# Patient Record
Sex: Female | Born: 1995
Health system: Southern US, Community
[De-identification: ages and names within clinical notes are randomized; demographics above are authoritative.]

## PROBLEM LIST (undated history)

## (undated) DIAGNOSIS — F329 Major depressive disorder, single episode, unspecified: Secondary | ICD-10-CM

## (undated) DIAGNOSIS — F419 Anxiety disorder, unspecified: Secondary | ICD-10-CM

## (undated) DIAGNOSIS — Z5189 Encounter for other specified aftercare: Secondary | ICD-10-CM

## (undated) DIAGNOSIS — O24419 Gestational diabetes mellitus in pregnancy, unspecified control: Secondary | ICD-10-CM

## (undated) DIAGNOSIS — Z8759 Personal history of other complications of pregnancy, childbirth and the puerperium: Secondary | ICD-10-CM

## (undated) DIAGNOSIS — F32A Depression, unspecified: Secondary | ICD-10-CM

## (undated) DIAGNOSIS — R000 Tachycardia, unspecified: Secondary | ICD-10-CM

## (undated) HISTORY — DX: Anxiety disorder, unspecified: F41.9

## (undated) HISTORY — PX: CHOLECYSTECTOMY: SHX55

## (undated) HISTORY — PX: BREAST SURGERY: SHX581

## (undated) HISTORY — PX: APPENDECTOMY: SHX54

---
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
## 2015-11-10 DIAGNOSIS — L03213 Periorbital cellulitis: Secondary | ICD-10-CM | POA: Diagnosis not present

## 2015-12-17 DIAGNOSIS — L259 Unspecified contact dermatitis, unspecified cause: Secondary | ICD-10-CM | POA: Diagnosis not present

## 2016-02-15 DIAGNOSIS — Z30432 Encounter for removal of intrauterine contraceptive device: Secondary | ICD-10-CM | POA: Diagnosis not present

## 2016-03-24 DIAGNOSIS — R300 Dysuria: Secondary | ICD-10-CM | POA: Diagnosis not present

## 2016-03-24 DIAGNOSIS — N3090 Cystitis, unspecified without hematuria: Secondary | ICD-10-CM | POA: Diagnosis not present

## 2016-03-24 DIAGNOSIS — N3001 Acute cystitis with hematuria: Secondary | ICD-10-CM | POA: Diagnosis not present

## 2016-05-08 DIAGNOSIS — J0300 Acute streptococcal tonsillitis, unspecified: Secondary | ICD-10-CM | POA: Diagnosis not present

## 2016-06-03 DIAGNOSIS — N979 Female infertility, unspecified: Secondary | ICD-10-CM | POA: Diagnosis not present

## 2016-06-16 DIAGNOSIS — N979 Female infertility, unspecified: Secondary | ICD-10-CM | POA: Diagnosis not present

## 2016-07-09 DIAGNOSIS — N979 Female infertility, unspecified: Secondary | ICD-10-CM | POA: Diagnosis not present

## 2016-07-28 DIAGNOSIS — N926 Irregular menstruation, unspecified: Secondary | ICD-10-CM | POA: Diagnosis not present

## 2016-07-28 DIAGNOSIS — N83209 Unspecified ovarian cyst, unspecified side: Secondary | ICD-10-CM | POA: Diagnosis not present

## 2016-09-08 DIAGNOSIS — R509 Fever, unspecified: Secondary | ICD-10-CM | POA: Diagnosis not present

## 2016-09-08 DIAGNOSIS — R69 Illness, unspecified: Secondary | ICD-10-CM | POA: Diagnosis not present

## 2016-09-16 DIAGNOSIS — N970 Female infertility associated with anovulation: Secondary | ICD-10-CM | POA: Diagnosis not present

## 2016-10-20 DIAGNOSIS — N970 Female infertility associated with anovulation: Secondary | ICD-10-CM | POA: Diagnosis not present

## 2016-11-30 DIAGNOSIS — J020 Streptococcal pharyngitis: Secondary | ICD-10-CM | POA: Diagnosis not present

## 2016-12-12 DIAGNOSIS — O0901 Supervision of pregnancy with history of infertility, first trimester: Secondary | ICD-10-CM | POA: Diagnosis not present

## 2016-12-12 DIAGNOSIS — Z3A01 Less than 8 weeks gestation of pregnancy: Secondary | ICD-10-CM | POA: Diagnosis not present

## 2017-01-09 DIAGNOSIS — O3680X0 Pregnancy with inconclusive fetal viability, not applicable or unspecified: Secondary | ICD-10-CM | POA: Diagnosis not present

## 2017-01-09 DIAGNOSIS — O219 Vomiting of pregnancy, unspecified: Secondary | ICD-10-CM | POA: Diagnosis not present

## 2017-01-09 DIAGNOSIS — Z369 Encounter for antenatal screening, unspecified: Secondary | ICD-10-CM | POA: Diagnosis not present

## 2017-01-19 DIAGNOSIS — R1013 Epigastric pain: Secondary | ICD-10-CM | POA: Diagnosis not present

## 2017-01-19 DIAGNOSIS — R1011 Right upper quadrant pain: Secondary | ICD-10-CM | POA: Diagnosis not present

## 2017-01-19 DIAGNOSIS — Z3A12 12 weeks gestation of pregnancy: Secondary | ICD-10-CM | POA: Diagnosis not present

## 2017-01-19 DIAGNOSIS — R112 Nausea with vomiting, unspecified: Secondary | ICD-10-CM | POA: Diagnosis not present

## 2017-01-19 DIAGNOSIS — O26891 Other specified pregnancy related conditions, first trimester: Secondary | ICD-10-CM | POA: Diagnosis not present

## 2017-01-22 DIAGNOSIS — Z3682 Encounter for antenatal screening for nuchal translucency: Secondary | ICD-10-CM | POA: Diagnosis not present

## 2017-01-22 DIAGNOSIS — O99211 Obesity complicating pregnancy, first trimester: Secondary | ICD-10-CM | POA: Diagnosis not present

## 2017-01-30 DIAGNOSIS — Z3A13 13 weeks gestation of pregnancy: Secondary | ICD-10-CM | POA: Diagnosis not present

## 2017-01-30 DIAGNOSIS — O99211 Obesity complicating pregnancy, first trimester: Secondary | ICD-10-CM | POA: Diagnosis not present

## 2017-01-30 DIAGNOSIS — O281 Abnormal biochemical finding on antenatal screening of mother: Secondary | ICD-10-CM | POA: Diagnosis not present

## 2017-01-30 DIAGNOSIS — Z3682 Encounter for antenatal screening for nuchal translucency: Secondary | ICD-10-CM | POA: Diagnosis not present

## 2017-02-19 DIAGNOSIS — Z368A Encounter for antenatal screening for other genetic defects: Secondary | ICD-10-CM | POA: Diagnosis not present

## 2017-02-19 DIAGNOSIS — O99212 Obesity complicating pregnancy, second trimester: Secondary | ICD-10-CM | POA: Diagnosis not present

## 2017-03-10 DIAGNOSIS — Z3A19 19 weeks gestation of pregnancy: Secondary | ICD-10-CM | POA: Diagnosis not present

## 2017-03-10 DIAGNOSIS — E860 Dehydration: Secondary | ICD-10-CM | POA: Diagnosis not present

## 2017-03-10 DIAGNOSIS — O219 Vomiting of pregnancy, unspecified: Secondary | ICD-10-CM | POA: Diagnosis not present

## 2017-03-10 DIAGNOSIS — O26891 Other specified pregnancy related conditions, first trimester: Secondary | ICD-10-CM | POA: Diagnosis not present

## 2017-03-10 DIAGNOSIS — R112 Nausea with vomiting, unspecified: Secondary | ICD-10-CM | POA: Diagnosis not present

## 2017-03-10 DIAGNOSIS — R109 Unspecified abdominal pain: Secondary | ICD-10-CM | POA: Diagnosis not present

## 2017-03-19 DIAGNOSIS — Z3689 Encounter for other specified antenatal screening: Secondary | ICD-10-CM | POA: Diagnosis not present

## 2017-03-19 DIAGNOSIS — O99212 Obesity complicating pregnancy, second trimester: Secondary | ICD-10-CM | POA: Diagnosis not present

## 2017-03-20 DIAGNOSIS — O139 Gestational [pregnancy-induced] hypertension without significant proteinuria, unspecified trimester: Secondary | ICD-10-CM | POA: Diagnosis not present

## 2017-04-30 DIAGNOSIS — O359XX0 Maternal care for (suspected) fetal abnormality and damage, unspecified, not applicable or unspecified: Secondary | ICD-10-CM | POA: Diagnosis not present

## 2017-04-30 DIAGNOSIS — Z3A26 26 weeks gestation of pregnancy: Secondary | ICD-10-CM | POA: Diagnosis not present

## 2017-04-30 DIAGNOSIS — O4402 Placenta previa specified as without hemorrhage, second trimester: Secondary | ICD-10-CM | POA: Diagnosis not present

## 2017-05-14 DIAGNOSIS — Z3A28 28 weeks gestation of pregnancy: Secondary | ICD-10-CM | POA: Diagnosis not present

## 2017-05-14 DIAGNOSIS — Z369 Encounter for antenatal screening, unspecified: Secondary | ICD-10-CM | POA: Diagnosis not present

## 2017-05-14 DIAGNOSIS — O10013 Pre-existing essential hypertension complicating pregnancy, third trimester: Secondary | ICD-10-CM | POA: Diagnosis not present

## 2017-05-14 DIAGNOSIS — Z3689 Encounter for other specified antenatal screening: Secondary | ICD-10-CM | POA: Diagnosis not present

## 2017-05-14 DIAGNOSIS — O09892 Supervision of other high risk pregnancies, second trimester: Secondary | ICD-10-CM | POA: Diagnosis not present

## 2017-05-14 DIAGNOSIS — Z6791 Unspecified blood type, Rh negative: Secondary | ICD-10-CM | POA: Diagnosis not present

## 2017-05-14 DIAGNOSIS — O99213 Obesity complicating pregnancy, third trimester: Secondary | ICD-10-CM | POA: Diagnosis not present

## 2017-05-18 DIAGNOSIS — R7302 Impaired glucose tolerance (oral): Secondary | ICD-10-CM | POA: Diagnosis not present

## 2017-06-04 DIAGNOSIS — R600 Localized edema: Secondary | ICD-10-CM | POA: Diagnosis not present

## 2017-06-04 DIAGNOSIS — Z3A31 31 weeks gestation of pregnancy: Secondary | ICD-10-CM | POA: Diagnosis not present

## 2017-06-04 DIAGNOSIS — R51 Headache: Secondary | ICD-10-CM | POA: Diagnosis not present

## 2017-06-04 DIAGNOSIS — O133 Gestational [pregnancy-induced] hypertension without significant proteinuria, third trimester: Secondary | ICD-10-CM | POA: Diagnosis not present

## 2017-06-05 DIAGNOSIS — R51 Headache: Secondary | ICD-10-CM | POA: Diagnosis not present

## 2017-06-05 DIAGNOSIS — Z3A31 31 weeks gestation of pregnancy: Secondary | ICD-10-CM | POA: Diagnosis not present

## 2017-06-05 DIAGNOSIS — O133 Gestational [pregnancy-induced] hypertension without significant proteinuria, third trimester: Secondary | ICD-10-CM | POA: Diagnosis not present

## 2017-06-05 DIAGNOSIS — R600 Localized edema: Secondary | ICD-10-CM | POA: Diagnosis not present

## 2017-06-09 DIAGNOSIS — O26843 Uterine size-date discrepancy, third trimester: Secondary | ICD-10-CM | POA: Diagnosis not present

## 2017-06-09 DIAGNOSIS — Z3A31 31 weeks gestation of pregnancy: Secondary | ICD-10-CM | POA: Diagnosis not present

## 2017-06-09 DIAGNOSIS — O9989 Other specified diseases and conditions complicating pregnancy, childbirth and the puerperium: Secondary | ICD-10-CM | POA: Diagnosis not present

## 2017-06-09 DIAGNOSIS — O133 Gestational [pregnancy-induced] hypertension without significant proteinuria, third trimester: Secondary | ICD-10-CM | POA: Diagnosis not present

## 2017-06-09 DIAGNOSIS — Z3689 Encounter for other specified antenatal screening: Secondary | ICD-10-CM | POA: Diagnosis not present

## 2017-06-09 DIAGNOSIS — R51 Headache: Secondary | ICD-10-CM | POA: Diagnosis not present

## 2017-06-17 DIAGNOSIS — Z3689 Encounter for other specified antenatal screening: Secondary | ICD-10-CM | POA: Diagnosis not present

## 2017-06-17 DIAGNOSIS — O133 Gestational [pregnancy-induced] hypertension without significant proteinuria, third trimester: Secondary | ICD-10-CM | POA: Diagnosis not present

## 2017-06-17 DIAGNOSIS — Z3A33 33 weeks gestation of pregnancy: Secondary | ICD-10-CM | POA: Diagnosis not present

## 2017-06-17 DIAGNOSIS — R600 Localized edema: Secondary | ICD-10-CM | POA: Diagnosis not present

## 2017-06-18 DIAGNOSIS — O133 Gestational [pregnancy-induced] hypertension without significant proteinuria, third trimester: Secondary | ICD-10-CM | POA: Diagnosis not present

## 2017-06-18 DIAGNOSIS — Z3A00 Weeks of gestation of pregnancy not specified: Secondary | ICD-10-CM | POA: Diagnosis not present

## 2017-06-19 DIAGNOSIS — Z3A33 33 weeks gestation of pregnancy: Secondary | ICD-10-CM | POA: Diagnosis not present

## 2017-06-19 DIAGNOSIS — Z3689 Encounter for other specified antenatal screening: Secondary | ICD-10-CM | POA: Diagnosis not present

## 2017-06-19 DIAGNOSIS — O9989 Other specified diseases and conditions complicating pregnancy, childbirth and the puerperium: Secondary | ICD-10-CM | POA: Diagnosis not present

## 2017-06-19 DIAGNOSIS — R51 Headache: Secondary | ICD-10-CM | POA: Diagnosis not present

## 2017-06-19 DIAGNOSIS — Z0379 Encounter for other suspected maternal and fetal conditions ruled out: Secondary | ICD-10-CM | POA: Diagnosis not present

## 2017-06-19 DIAGNOSIS — H538 Other visual disturbances: Secondary | ICD-10-CM | POA: Diagnosis not present

## 2017-06-22 DIAGNOSIS — O133 Gestational [pregnancy-induced] hypertension without significant proteinuria, third trimester: Secondary | ICD-10-CM | POA: Diagnosis not present

## 2017-06-22 DIAGNOSIS — Z3A33 33 weeks gestation of pregnancy: Secondary | ICD-10-CM | POA: Diagnosis not present

## 2017-06-22 DIAGNOSIS — Z3689 Encounter for other specified antenatal screening: Secondary | ICD-10-CM | POA: Diagnosis not present

## 2017-06-27 DIAGNOSIS — J0100 Acute maxillary sinusitis, unspecified: Secondary | ICD-10-CM | POA: Diagnosis not present

## 2017-06-29 DIAGNOSIS — R079 Chest pain, unspecified: Secondary | ICD-10-CM | POA: Diagnosis not present

## 2017-06-29 DIAGNOSIS — O99284 Endocrine, nutritional and metabolic diseases complicating childbirth: Secondary | ICD-10-CM | POA: Diagnosis not present

## 2017-06-29 DIAGNOSIS — Z3483 Encounter for supervision of other normal pregnancy, third trimester: Secondary | ICD-10-CM | POA: Diagnosis not present

## 2017-06-29 DIAGNOSIS — O339 Maternal care for disproportion, unspecified: Secondary | ICD-10-CM | POA: Diagnosis not present

## 2017-06-29 DIAGNOSIS — E861 Hypovolemia: Secondary | ICD-10-CM | POA: Diagnosis not present

## 2017-06-29 DIAGNOSIS — I361 Nonrheumatic tricuspid (valve) insufficiency: Secondary | ICD-10-CM | POA: Diagnosis not present

## 2017-06-29 DIAGNOSIS — L22 Diaper dermatitis: Secondary | ICD-10-CM | POA: Diagnosis not present

## 2017-06-29 DIAGNOSIS — O1414 Severe pre-eclampsia complicating childbirth: Secondary | ICD-10-CM | POA: Diagnosis not present

## 2017-06-29 DIAGNOSIS — Z412 Encounter for routine and ritual male circumcision: Secondary | ICD-10-CM | POA: Diagnosis not present

## 2017-06-29 DIAGNOSIS — O1493 Unspecified pre-eclampsia, third trimester: Secondary | ICD-10-CM | POA: Diagnosis not present

## 2017-06-29 DIAGNOSIS — O1413 Severe pre-eclampsia, third trimester: Secondary | ICD-10-CM | POA: Diagnosis not present

## 2017-06-29 DIAGNOSIS — I517 Cardiomegaly: Secondary | ICD-10-CM | POA: Diagnosis not present

## 2017-06-29 DIAGNOSIS — Z3A00 Weeks of gestation of pregnancy not specified: Secondary | ICD-10-CM | POA: Diagnosis not present

## 2017-06-29 DIAGNOSIS — O9902 Anemia complicating childbirth: Secondary | ICD-10-CM | POA: Diagnosis not present

## 2017-06-29 DIAGNOSIS — R0789 Other chest pain: Secondary | ICD-10-CM | POA: Diagnosis not present

## 2017-06-29 DIAGNOSIS — Z3A35 35 weeks gestation of pregnancy: Secondary | ICD-10-CM | POA: Diagnosis not present

## 2017-06-29 DIAGNOSIS — N471 Phimosis: Secondary | ICD-10-CM | POA: Diagnosis not present

## 2017-06-29 DIAGNOSIS — R000 Tachycardia, unspecified: Secondary | ICD-10-CM | POA: Diagnosis not present

## 2017-06-29 DIAGNOSIS — O334XX1 Maternal care for disproportion of mixed maternal and fetal origin, fetus 1: Secondary | ICD-10-CM | POA: Diagnosis not present

## 2017-06-29 DIAGNOSIS — D649 Anemia, unspecified: Secondary | ICD-10-CM | POA: Diagnosis not present

## 2017-06-29 DIAGNOSIS — D62 Acute posthemorrhagic anemia: Secondary | ICD-10-CM | POA: Diagnosis not present

## 2017-06-29 DIAGNOSIS — Z3689 Encounter for other specified antenatal screening: Secondary | ICD-10-CM | POA: Diagnosis not present

## 2017-06-30 DIAGNOSIS — R000 Tachycardia, unspecified: Secondary | ICD-10-CM | POA: Diagnosis not present

## 2017-07-03 DIAGNOSIS — Z3689 Encounter for other specified antenatal screening: Secondary | ICD-10-CM | POA: Diagnosis not present

## 2017-07-05 DIAGNOSIS — O339 Maternal care for disproportion, unspecified: Secondary | ICD-10-CM | POA: Diagnosis not present

## 2017-07-05 DIAGNOSIS — O1493 Unspecified pre-eclampsia, third trimester: Secondary | ICD-10-CM | POA: Diagnosis not present

## 2017-07-05 DIAGNOSIS — R000 Tachycardia, unspecified: Secondary | ICD-10-CM

## 2017-07-05 DIAGNOSIS — O334XX1 Maternal care for disproportion of mixed maternal and fetal origin, fetus 1: Secondary | ICD-10-CM | POA: Diagnosis not present

## 2017-07-05 HISTORY — DX: Tachycardia, unspecified: R00.0

## 2017-07-06 DIAGNOSIS — R0789 Other chest pain: Secondary | ICD-10-CM | POA: Diagnosis not present

## 2017-07-06 DIAGNOSIS — R000 Tachycardia, unspecified: Secondary | ICD-10-CM | POA: Diagnosis not present

## 2017-07-06 DIAGNOSIS — Z412 Encounter for routine and ritual male circumcision: Secondary | ICD-10-CM | POA: Diagnosis not present

## 2017-07-06 DIAGNOSIS — R079 Chest pain, unspecified: Secondary | ICD-10-CM | POA: Diagnosis not present

## 2017-07-06 DIAGNOSIS — O1413 Severe pre-eclampsia, third trimester: Secondary | ICD-10-CM | POA: Diagnosis not present

## 2017-07-06 DIAGNOSIS — D649 Anemia, unspecified: Secondary | ICD-10-CM | POA: Diagnosis not present

## 2017-07-07 DIAGNOSIS — I517 Cardiomegaly: Secondary | ICD-10-CM | POA: Diagnosis not present

## 2017-07-07 DIAGNOSIS — I361 Nonrheumatic tricuspid (valve) insufficiency: Secondary | ICD-10-CM | POA: Diagnosis not present

## 2017-07-07 DIAGNOSIS — D649 Anemia, unspecified: Secondary | ICD-10-CM | POA: Diagnosis not present

## 2017-07-07 DIAGNOSIS — O1413 Severe pre-eclampsia, third trimester: Secondary | ICD-10-CM | POA: Diagnosis not present

## 2017-07-07 DIAGNOSIS — Z3A00 Weeks of gestation of pregnancy not specified: Secondary | ICD-10-CM | POA: Diagnosis not present

## 2017-07-07 DIAGNOSIS — R000 Tachycardia, unspecified: Secondary | ICD-10-CM | POA: Diagnosis not present

## 2017-07-08 DIAGNOSIS — D62 Acute posthemorrhagic anemia: Secondary | ICD-10-CM | POA: Diagnosis not present

## 2017-07-08 DIAGNOSIS — R0789 Other chest pain: Secondary | ICD-10-CM | POA: Diagnosis not present

## 2017-07-08 DIAGNOSIS — R000 Tachycardia, unspecified: Secondary | ICD-10-CM | POA: Diagnosis not present

## 2017-07-09 DIAGNOSIS — L22 Diaper dermatitis: Secondary | ICD-10-CM | POA: Diagnosis not present

## 2017-11-13 DIAGNOSIS — Z1331 Encounter for screening for depression: Secondary | ICD-10-CM | POA: Diagnosis not present

## 2017-11-13 DIAGNOSIS — Z1339 Encounter for screening examination for other mental health and behavioral disorders: Secondary | ICD-10-CM | POA: Diagnosis not present

## 2017-11-13 DIAGNOSIS — Z30011 Encounter for initial prescription of contraceptive pills: Secondary | ICD-10-CM | POA: Diagnosis not present

## 2017-11-13 DIAGNOSIS — Z6841 Body Mass Index (BMI) 40.0 and over, adult: Secondary | ICD-10-CM | POA: Diagnosis not present

## 2018-05-16 DIAGNOSIS — N3090 Cystitis, unspecified without hematuria: Secondary | ICD-10-CM | POA: Diagnosis not present

## 2018-07-06 DIAGNOSIS — F319 Bipolar disorder, unspecified: Secondary | ICD-10-CM | POA: Diagnosis not present

## 2018-07-06 DIAGNOSIS — Z6841 Body Mass Index (BMI) 40.0 and over, adult: Secondary | ICD-10-CM | POA: Diagnosis not present

## 2018-07-06 DIAGNOSIS — Z23 Encounter for immunization: Secondary | ICD-10-CM | POA: Diagnosis not present

## 2018-07-22 DIAGNOSIS — F319 Bipolar disorder, unspecified: Secondary | ICD-10-CM | POA: Diagnosis not present

## 2018-07-22 DIAGNOSIS — Z6841 Body Mass Index (BMI) 40.0 and over, adult: Secondary | ICD-10-CM | POA: Diagnosis not present

## 2018-08-06 DIAGNOSIS — N3000 Acute cystitis without hematuria: Secondary | ICD-10-CM | POA: Diagnosis not present

## 2018-08-24 DIAGNOSIS — F3131 Bipolar disorder, current episode depressed, mild: Secondary | ICD-10-CM | POA: Diagnosis not present

## 2018-08-24 DIAGNOSIS — F410 Panic disorder [episodic paroxysmal anxiety] without agoraphobia: Secondary | ICD-10-CM | POA: Diagnosis not present

## 2018-09-07 DIAGNOSIS — F3131 Bipolar disorder, current episode depressed, mild: Secondary | ICD-10-CM | POA: Diagnosis not present

## 2018-09-28 DIAGNOSIS — F3131 Bipolar disorder, current episode depressed, mild: Secondary | ICD-10-CM | POA: Diagnosis not present

## 2019-01-07 DIAGNOSIS — Z98891 History of uterine scar from previous surgery: Secondary | ICD-10-CM | POA: Insufficient documentation

## 2019-01-07 DIAGNOSIS — R638 Other symptoms and signs concerning food and fluid intake: Secondary | ICD-10-CM | POA: Insufficient documentation

## 2019-01-07 DIAGNOSIS — Z3201 Encounter for pregnancy test, result positive: Secondary | ICD-10-CM | POA: Diagnosis not present

## 2019-01-07 DIAGNOSIS — Z3491 Encounter for supervision of normal pregnancy, unspecified, first trimester: Secondary | ICD-10-CM | POA: Diagnosis not present

## 2019-01-07 DIAGNOSIS — N925 Other specified irregular menstruation: Secondary | ICD-10-CM | POA: Diagnosis not present

## 2019-01-07 DIAGNOSIS — Z124 Encounter for screening for malignant neoplasm of cervix: Secondary | ICD-10-CM | POA: Diagnosis not present

## 2019-01-07 DIAGNOSIS — Z8751 Personal history of pre-term labor: Secondary | ICD-10-CM | POA: Insufficient documentation

## 2019-02-16 DIAGNOSIS — Z3480 Encounter for supervision of other normal pregnancy, unspecified trimester: Secondary | ICD-10-CM | POA: Diagnosis not present

## 2019-02-16 DIAGNOSIS — Z369 Encounter for antenatal screening, unspecified: Secondary | ICD-10-CM | POA: Diagnosis not present

## 2019-02-16 LAB — OB RESULTS CONSOLE RPR: RPR: NONREACTIVE

## 2019-02-16 LAB — OB RESULTS CONSOLE ABO/RH: RH Type: NEGATIVE

## 2019-02-16 LAB — OB RESULTS CONSOLE GC/CHLAMYDIA
Chlamydia: NEGATIVE
Gonorrhea: NEGATIVE

## 2019-02-16 LAB — OB RESULTS CONSOLE HIV ANTIBODY (ROUTINE TESTING): HIV: NONREACTIVE

## 2019-02-16 LAB — OB RESULTS CONSOLE RUBELLA ANTIBODY, IGM: Rubella: NON-IMMUNE/NOT IMMUNE

## 2019-02-16 LAB — OB RESULTS CONSOLE HEPATITIS B SURFACE ANTIGEN: Hepatitis B Surface Ag: NEGATIVE

## 2019-02-16 LAB — OB RESULTS CONSOLE ANTIBODY SCREEN: Antibody Screen: NEGATIVE

## 2019-03-29 DIAGNOSIS — O99212 Obesity complicating pregnancy, second trimester: Secondary | ICD-10-CM | POA: Diagnosis not present

## 2019-03-29 DIAGNOSIS — Z36 Encounter for antenatal screening for chromosomal anomalies: Secondary | ICD-10-CM | POA: Diagnosis not present

## 2019-05-26 DIAGNOSIS — Z23 Encounter for immunization: Secondary | ICD-10-CM | POA: Diagnosis not present

## 2019-06-01 DIAGNOSIS — Y718 Miscellaneous cardiovascular devices associated with adverse incidents, not elsewhere classified: Secondary | ICD-10-CM | POA: Diagnosis not present

## 2019-06-01 DIAGNOSIS — U071 COVID-19: Secondary | ICD-10-CM | POA: Diagnosis not present

## 2019-06-01 DIAGNOSIS — R509 Fever, unspecified: Secondary | ICD-10-CM | POA: Diagnosis not present

## 2019-06-08 ENCOUNTER — Other Ambulatory Visit: Payer: Self-pay

## 2019-06-08 ENCOUNTER — Encounter (HOSPITAL_COMMUNITY): Payer: Self-pay | Admitting: *Deleted

## 2019-06-08 ENCOUNTER — Emergency Department (HOSPITAL_COMMUNITY): Payer: BC Managed Care – PPO

## 2019-06-08 ENCOUNTER — Inpatient Hospital Stay (HOSPITAL_COMMUNITY)
Admission: EM | Admit: 2019-06-08 | Discharge: 2019-06-13 | DRG: 831 | Disposition: A | Discharge status: Home / Self Care | Payer: BC Managed Care – PPO | Attending: Internal Medicine | Admitting: Internal Medicine

## 2019-06-08 DIAGNOSIS — D649 Anemia, unspecified: Secondary | ICD-10-CM | POA: Diagnosis not present

## 2019-06-08 DIAGNOSIS — O99513 Diseases of the respiratory system complicating pregnancy, third trimester: Secondary | ICD-10-CM | POA: Diagnosis present

## 2019-06-08 DIAGNOSIS — O99013 Anemia complicating pregnancy, third trimester: Secondary | ICD-10-CM | POA: Diagnosis not present

## 2019-06-08 DIAGNOSIS — O99891 Other specified diseases and conditions complicating pregnancy: Secondary | ICD-10-CM | POA: Diagnosis not present

## 2019-06-08 DIAGNOSIS — E860 Dehydration: Secondary | ICD-10-CM | POA: Diagnosis present

## 2019-06-08 DIAGNOSIS — O98813 Other maternal infectious and parasitic diseases complicating pregnancy, third trimester: Secondary | ICD-10-CM | POA: Diagnosis not present

## 2019-06-08 DIAGNOSIS — O99283 Endocrine, nutritional and metabolic diseases complicating pregnancy, third trimester: Secondary | ICD-10-CM | POA: Diagnosis not present

## 2019-06-08 DIAGNOSIS — Z3A28 28 weeks gestation of pregnancy: Secondary | ICD-10-CM | POA: Diagnosis not present

## 2019-06-08 DIAGNOSIS — O98913 Unspecified maternal infectious and parasitic disease complicating pregnancy, third trimester: Secondary | ICD-10-CM | POA: Diagnosis present

## 2019-06-08 DIAGNOSIS — O99512 Diseases of the respiratory system complicating pregnancy, second trimester: Secondary | ICD-10-CM | POA: Diagnosis not present

## 2019-06-08 DIAGNOSIS — O368130 Decreased fetal movements, third trimester, not applicable or unspecified: Secondary | ICD-10-CM | POA: Diagnosis not present

## 2019-06-08 DIAGNOSIS — U071 COVID-19: Secondary | ICD-10-CM | POA: Diagnosis not present

## 2019-06-08 DIAGNOSIS — O98513 Other viral diseases complicating pregnancy, third trimester: Principal | ICD-10-CM | POA: Diagnosis present

## 2019-06-08 DIAGNOSIS — J1289 Other viral pneumonia: Secondary | ICD-10-CM | POA: Diagnosis present

## 2019-06-08 DIAGNOSIS — R000 Tachycardia, unspecified: Secondary | ICD-10-CM | POA: Diagnosis not present

## 2019-06-08 DIAGNOSIS — R0902 Hypoxemia: Secondary | ICD-10-CM | POA: Diagnosis not present

## 2019-06-08 DIAGNOSIS — O98512 Other viral diseases complicating pregnancy, second trimester: Secondary | ICD-10-CM | POA: Diagnosis not present

## 2019-06-08 DIAGNOSIS — J1281 Pneumonia due to SARS-associated coronavirus: Secondary | ICD-10-CM | POA: Diagnosis not present

## 2019-06-08 DIAGNOSIS — R0602 Shortness of breath: Secondary | ICD-10-CM | POA: Diagnosis not present

## 2019-06-08 LAB — COMPREHENSIVE METABOLIC PANEL
ALT: 12 U/L (ref 0–44)
AST: 14 U/L — ABNORMAL LOW (ref 15–41)
Albumin: 2.5 g/dL — ABNORMAL LOW (ref 3.5–5.0)
Alkaline Phosphatase: 78 U/L (ref 38–126)
Anion gap: 12 (ref 5–15)
BUN: <5 mg/dL — ABNORMAL LOW (ref 6–20)
CO2: 19 mmol/L — ABNORMAL LOW (ref 22–32)
Calcium: 8.7 mg/dL — ABNORMAL LOW (ref 8.9–10.3)
Chloride: 109 mmol/L (ref 98–111)
Creatinine, Ser: 0.66 mg/dL (ref 0.44–1.00)
GFR calc Af Amer: >60 mL/min (ref >60)
GFR calc non Af Amer: >60 mL/min (ref >60)
Glucose, Bld: 80 mg/dL (ref 70–99)
Potassium: 3.6 mmol/L (ref 3.5–5.1)
Sodium: 140 mmol/L (ref 135–145)
Total Bilirubin: 0.5 mg/dL (ref 0.3–1.2)
Total Protein: 5.9 g/dL — ABNORMAL LOW (ref 6.5–8.1)

## 2019-06-08 LAB — URINALYSIS, ROUTINE W REFLEX MICROSCOPIC
Bilirubin Urine: NEGATIVE
Glucose, UA: NEGATIVE mg/dL
Hgb urine dipstick: NEGATIVE
Ketones, ur: 20 mg/dL — AB
Leukocytes,Ua: NEGATIVE
Nitrite: NEGATIVE
Protein, ur: NEGATIVE mg/dL
Specific Gravity, Urine: 1.006 (ref 1.005–1.030)
pH: 7 (ref 5.0–8.0)

## 2019-06-08 LAB — CBC WITH DIFFERENTIAL/PLATELET
Abs Immature Granulocytes: 0.04 10*3/uL (ref 0.00–0.07)
Basophils Absolute: 0 10*3/uL (ref 0.0–0.1)
Basophils Relative: 0 %
Eosinophils Absolute: 0 10*3/uL (ref 0.0–0.5)
Eosinophils Relative: 1 %
HCT: 36.8 % (ref 36.0–46.0)
Hemoglobin: 12.1 g/dL (ref 12.0–15.0)
Immature Granulocytes: 1 %
Lymphocytes Relative: 31 %
Lymphs Abs: 1.3 10*3/uL (ref 0.7–4.0)
MCH: 30.2 pg (ref 26.0–34.0)
MCHC: 32.9 g/dL (ref 30.0–36.0)
MCV: 91.8 fL (ref 80.0–100.0)
Monocytes Absolute: 0.3 10*3/uL (ref 0.1–1.0)
Monocytes Relative: 7 %
Neutro Abs: 2.5 10*3/uL (ref 1.7–7.7)
Neutrophils Relative %: 60 %
Platelets: 160 10*3/uL (ref 150–400)
RBC: 4.01 MIL/uL (ref 3.87–5.11)
RDW: 13.2 % (ref 11.5–15.5)
WBC: 4.2 10*3/uL (ref 4.0–10.5)
nRBC: 0 % (ref 0.0–0.2)

## 2019-06-08 LAB — PROCALCITONIN: Procalcitonin: <0.1 ng/mL

## 2019-06-08 LAB — FIBRINOGEN: Fibrinogen: 567 mg/dL — ABNORMAL HIGH (ref 210–475)

## 2019-06-08 LAB — D-DIMER, QUANTITATIVE: D-Dimer, Quant: 1.76 ug/mL-FEU — ABNORMAL HIGH (ref 0.00–0.50)

## 2019-06-08 LAB — TRIGLYCERIDES: Triglycerides: 262 mg/dL — ABNORMAL HIGH (ref <150)

## 2019-06-08 LAB — LACTIC ACID, PLASMA: Lactic Acid, Venous: 0.9 mmol/L (ref 0.5–1.9)

## 2019-06-08 LAB — SARS CORONAVIRUS 2 BY RT PCR (HOSPITAL ORDER, PERFORMED IN ~~LOC~~ HOSPITAL LAB): SARS Coronavirus 2: POSITIVE — AB

## 2019-06-08 LAB — C-REACTIVE PROTEIN: CRP: 3.1 mg/dL — ABNORMAL HIGH (ref <1.0)

## 2019-06-08 LAB — LACTATE DEHYDROGENASE: LDH: 143 U/L (ref 98–192)

## 2019-06-08 LAB — FERRITIN: Ferritin: 14 ng/mL (ref 11–307)

## 2019-06-08 MED ORDER — ZINC SULFATE 220 (50 ZN) MG PO CAPS
220.0000 mg | ORAL_CAPSULE | Freq: Every day | ORAL | Status: DC
  Administered 2019-06-10 – 2019-06-13 (×4): 220 mg via ORAL
  Filled 2019-06-08 (×5): qty 1

## 2019-06-08 MED ORDER — SODIUM CHLORIDE 0.9 % IV SOLN
100.0000 mg | INTRAVENOUS | Status: AC
  Administered 2019-06-09 – 2019-06-12 (×4): 100 mg via INTRAVENOUS
  Filled 2019-06-08 (×4): qty 20

## 2019-06-08 MED ORDER — DEXAMETHASONE SODIUM PHOSPHATE 10 MG/ML IJ SOLN
6.0000 mg | INTRAMUSCULAR | Status: DC
  Administered 2019-06-09 – 2019-06-12 (×4): 6 mg via INTRAVENOUS
  Filled 2019-06-08 (×4): qty 1

## 2019-06-08 MED ORDER — SODIUM CHLORIDE 0.9 % IV SOLN
1.0000 g | Freq: Once | INTRAVENOUS | Status: AC
  Administered 2019-06-08: 1 g via INTRAVENOUS
  Filled 2019-06-08: qty 10

## 2019-06-08 MED ORDER — ACETAMINOPHEN 500 MG PO TABS
1000.0000 mg | ORAL_TABLET | Freq: Once | ORAL | Status: AC
  Administered 2019-06-08: 1000 mg via ORAL
  Filled 2019-06-08: qty 2

## 2019-06-08 MED ORDER — SODIUM CHLORIDE 0.9 % IV SOLN
500.0000 mg | Freq: Once | INTRAVENOUS | Status: AC
  Administered 2019-06-08: 500 mg via INTRAVENOUS
  Filled 2019-06-08: qty 500

## 2019-06-08 MED ORDER — SODIUM CHLORIDE 0.9 % IV SOLN
INTRAVENOUS | Status: DC
  Administered 2019-06-09 – 2019-06-11 (×5): via INTRAVENOUS

## 2019-06-08 MED ORDER — SODIUM CHLORIDE 0.9% FLUSH
3.0000 mL | Freq: Two times a day (BID) | INTRAVENOUS | Status: DC
  Administered 2019-06-09 – 2019-06-13 (×9): 3 mL via INTRAVENOUS

## 2019-06-08 MED ORDER — VITAMIN C 500 MG PO TABS
500.0000 mg | ORAL_TABLET | Freq: Every day | ORAL | Status: DC
  Administered 2019-06-09 – 2019-06-13 (×5): 500 mg via ORAL
  Filled 2019-06-08 (×5): qty 1

## 2019-06-08 MED ORDER — FOLIC ACID 1 MG PO TABS
1.0000 mg | ORAL_TABLET | Freq: Every day | ORAL | Status: DC
  Administered 2019-06-09 – 2019-06-13 (×5): 1 mg via ORAL
  Filled 2019-06-08 (×5): qty 1

## 2019-06-08 MED ORDER — DEXAMETHASONE SODIUM PHOSPHATE 10 MG/ML IJ SOLN
10.0000 mg | Freq: Once | INTRAMUSCULAR | Status: AC
  Administered 2019-06-08: 10 mg via INTRAVENOUS
  Filled 2019-06-08: qty 1

## 2019-06-08 MED ORDER — SODIUM CHLORIDE 0.9 % IV SOLN
1000.0000 mL | INTRAVENOUS | Status: DC
  Administered 2019-06-08 (×2): 1000 mL via INTRAVENOUS

## 2019-06-08 MED ORDER — ENOXAPARIN SODIUM 40 MG/0.4ML ~~LOC~~ SOLN
40.0000 mg | SUBCUTANEOUS | Status: DC
  Administered 2019-06-09 (×2): 40 mg via SUBCUTANEOUS
  Filled 2019-06-08 (×2): qty 0.4

## 2019-06-08 MED ORDER — SODIUM CHLORIDE 0.9 % IV SOLN
200.0000 mg | Freq: Once | INTRAVENOUS | Status: AC
  Administered 2019-06-09: 200 mg via INTRAVENOUS
  Filled 2019-06-08: qty 40

## 2019-06-08 MED ORDER — IOHEXOL 350 MG/ML SOLN
100.0000 mL | Freq: Once | INTRAVENOUS | Status: AC | PRN
  Administered 2019-06-08: 100 mL via INTRAVENOUS

## 2019-06-08 MED ORDER — ONDANSETRON HCL 4 MG/2ML IJ SOLN
4.0000 mg | Freq: Once | INTRAMUSCULAR | Status: AC
  Administered 2019-06-08: 4 mg via INTRAVENOUS
  Filled 2019-06-08: qty 2

## 2019-06-08 MED ORDER — GUAIFENESIN-DM 100-10 MG/5ML PO SYRP: 10.0000 mL | ORAL_SOLUTION | ORAL | Status: DC | PRN

## 2019-06-08 MED ORDER — ACETAMINOPHEN 325 MG PO TABS
650.0000 mg | ORAL_TABLET | Freq: Four times a day (QID) | ORAL | Status: DC | PRN
  Administered 2019-06-09 – 2019-06-10 (×2): 650 mg via ORAL
  Filled 2019-06-08 (×2): qty 2

## 2019-06-08 MED ORDER — SODIUM CHLORIDE 0.9 % IV SOLN
Freq: Once | INTRAVENOUS | Status: AC
  Administered 2019-06-08: 19:00:00 via INTRAVENOUS

## 2019-06-08 NOTE — H&P (Addendum)
Marissa Andrade TKW:[id number] DOB: 28-Aug-1995 DOA: 06/08/2019     PCP: Patient, No Pcp Per   Outpatient Specialists:   Lovie Chol  Patient arrived to ER on 06/08/19 at 1632  Patient coming from: home Lives  With family    Chief Complaint:  Chief Complaint  Patient presents with   Shortness of Breath   COVID +   Pregnant    HPI: Marissa Andrade is a 23 y.o. female with medical history significant of COVID viral illness, [redacted] wks gestation     Presented with   tracing in decreased fetal movements, Patient noted decreased urination and urine started to be concentrated although she has been trying to drink. Patient reports she has been sick for about 10 days denies any fever Pt has been isolating the only time she went out was grocery shopping or to her OB/GYN her husband is currently out of state.  Her 35-year-old son stays with her at home and does not attend daycare.  She tested positive on the next day after going to OB/GYN. Denies any fevers but have had some cough and is worse when her heart rate goes up.   Infectious risk factors:  Reports shortness of breath,   In  ER RAPID COVID TEST   POSITIVE,    Lab Results  Component Value Date   SARSCOV2NAA POSITIVE (A) 06/08/2019     While in ER:  on arrival to emergency department patient was significantly short of breath and tachypneic with increased work of breathing Fetal heart rate up to 200 bpm she was given IV fluids and fetal heart rate improved to normal range with improved variability While in the emergency department patient noted that she was having intermittent tightening of her abdomen reminiscent of contractions.  Bladder was noted to be distended.  Leak was applied patient urinated 400 mL with improvement. For safety patient was placed on fetal monitoring fetal heart beat 150 range Nurse discussed case with fetal specialist nurse who will come to assess the patient The following Work up has been ordered so  far:  Orders Placed This Encounter  Procedures   SARS Coronavirus 2 by RT PCR (hospital order, performed in [facility] hospital lab) Nasopharyngeal Nasopharyngeal Swab   Blood Culture (routine x 2)   DG Chest Port 1 View   CT Angio Chest PE W and/or Wo Contrast   Lactic acid, plasma   CBC WITH DIFFERENTIAL   Comprehensive metabolic panel   D-dimer, quantitative   Procalcitonin   Lactate dehydrogenase   Ferritin   Triglycerides   Fibrinogen   C-reactive protein   Urinalysis, Routine w reflex microscopic   Diet NPO time specified   Cardiac monitoring   Insert peripheral IV x 2   Initiate Carrier Fluid Protocol   Place surgical mask on patient   Patient to wear surgical mask during transportation   Assess patient for ability to self-prone. If able (can move self in bed, ambulate) and stable (SpO2 and oxygen requirement):   RN/NT - Document specific oxygen requirements in [facility]   Notify EDP if new oxygen requirements escalates > 4L per minute [place]   RN to draw the following extra tubes:   Fetal monitoring   Consult to hospitalist  ALL PATIENTS BEING ADMITTED/HAVING PROCEDURES NEED COVID-19 SCREENING   Airborne and Contact precautions   Pulse oximetry, continuous   ED EKG 12-Lead    Following Medications were ordered in ER: Medications  0.9 %  sodium chloride infusion (1,000  mLs Intravenous New Bag/Given (Non-Interop) 06/08/19 1842)  cefTRIAXone (ROCEPHIN) 1 g in sodium chloride 0.9 % 100 mL IVPB (1 g Intravenous New Bag/Given 06/08/19 2216)  azithromycin (ZITHROMAX) 500 mg in sodium chloride 0.9 % 250 mL IVPB (500 mg Intravenous New Bag/Given 06/08/19 2215)  acetaminophen (TYLENOL) tablet 1,000 mg (1,000 mg Oral Given 06/08/19 1726)  0.9 %  sodium chloride infusion ( Intravenous Stopped 06/08/19 1907)  iohexol (OMNIPAQUE) 350 MG/ML injection 100 mL (100 mLs Intravenous Contrast Given 06/08/19 2112)  dexamethasone (DECADRON) injection 10 mg (10 mg  Intravenous Given 06/08/19 2207)        Consult Orders  (From admission, onward)         Start     Ordered   06/08/19 2155  Consult to hospitalist  ALL PATIENTS BEING ADMITTED/HAVING PROCEDURES NEED COVID-19 SCREENING Paged [facility], roxanne  Once    Comments: ALL PATIENTS BEING ADMITTED/HAVING PROCEDURES NEED COVID-19 SCREENING  Provider:  (Not yet assigned)  Question Answer Comment  Place call to: [facility]   Reason for Consult Admit      06/08/19 2154          ER Provider Called: OB/GYN   Dr Claiborne Billingsallahan They Recommend admit to medicine to 2W Will see and continue to follow    Significant initial  Findings: Abnormal Labs Reviewed  SARS CORONAVIRUS 2 BY RT PCR (HOSPITAL ORDER, PERFORMED IN [facility]) - Abnormal; Notable for the following components:      Result Value   SARS Coronavirus 2 POSITIVE (*)    All other components within normal limits  COMPREHENSIVE METABOLIC PANEL - Abnormal; Notable for the following components:   CO2 19 (*)    BUN <5 (*)    Calcium 8.7 (*)    Total Protein 5.9 (*)    Albumin 2.5 (*)    AST 14 (*)    All other components within normal limits  D-DIMER, QUANTITATIVE (NOT AT [facility]) - Abnormal; Notable for the following components:   D-Dimer, Quant 1.76 (*)    All other components within normal limits  TRIGLYCERIDES - Abnormal; Notable for the following components:   Triglycerides 262 (*)    All other components within normal limits  FIBRINOGEN - Abnormal; Notable for the following components:   Fibrinogen 567 (*)    All other components within normal limits  URINALYSIS, ROUTINE W REFLEX MICROSCOPIC - Abnormal; Notable for the following components:   Color, Urine STRAW (*)    APPearance CLOUDY (*)    Ketones, ur 20 (*)    All other components within normal limits     Otherwise labs showing:    Recent Labs  Lab 06/08/19 1725  NA 140  K 3.6  CO2 19*  GLUCOSE 80  BUN <5*  CREATININE 0.66  CALCIUM 8.7*     Cr    stable,   Lab Results  Component Value Date   CREATININE 0.66 06/08/2019    Recent Labs  Lab 06/08/19 1725  AST 14*  ALT 12  ALKPHOS 78  BILITOT 0.5  PROT 5.9*  ALBUMIN 2.5*   Lab Results  Component Value Date   CALCIUM 8.7 (L) 06/08/2019      WBC      Component Value Date/Time   WBC 4.2 06/08/2019 1725   ANC    Component Value Date/Time   NEUTROABS 2.5 06/08/2019 1725   ALC No components found for: LYMPHAB    Plt: Lab Results  Component Value Date  PLT 160 06/08/2019     Lactic Acid, Venous    Component Value Date/Time   LATICACIDVEN 0.9 06/08/2019 1725    Procalcitonin   Ordered   COVID-19 Labs  Recent Labs    06/08/19 1725  DDIMER 1.76*  LDH 143    Lab Results  Component Value Date   SARSCOV2NAA POSITIVE (A) 06/08/2019       HG/HCT  Stable,     Component Value Date/Time   HGB 12.1 06/08/2019 1725   HCT 36.8 06/08/2019 1725     ECG: Ordered      UA   no evidence of UTI   Urine analysis:    Component Value Date/Time   COLORURINE STRAW (A) 06/08/2019 1900   APPEARANCEUR CLOUDY (A) 06/08/2019 1900   LABSPEC 1.006 06/08/2019 1900   PHURINE 7.0 06/08/2019 1900   GLUCOSEU NEGATIVE 06/08/2019 1900   HGBUR NEGATIVE 06/08/2019 1900   BILIRUBINUR NEGATIVE 06/08/2019 1900   KETONESUR 20 (A) 06/08/2019 1900   PROTEINUR NEGATIVE 06/08/2019 1900   NITRITE NEGATIVE 06/08/2019 1900   LEUKOCYTESUR NEGATIVE 06/08/2019 1900      Ordered   acute    CTA chest -suboptimal opacification but no large central lobar pulmonary arteries PE groundglass infiltrates bilaterally    ED Triage Vitals [06/08/19 1654]  Enc Vitals Group     BP 134/78     Pulse Rate (!) 130     Resp (!) 27     Temp 98.7 F (37.1 C)     Temp Source Oral     SpO2 98 %     Weight      Height      Head Circumference      Peak Flow      Pain Score      Pain Loc      Pain Edu?      Excl. in GC?   WUJW(11)@       Latest  Blood pressure 99/65,  pulse (!) 111, temperature 97.9 F (36.6 C), temperature source Temporal, resp. rate (!) 22, SpO2 99 %.     Hospitalist was called for admission for COVID PNA   Review of Systems:    Pertinent positives include: fatigue,  shortness of breath at rest. dyspnea on exertion Constitutional:  No weight loss, night sweats, Fevers, chills,  weight loss  HEENT:  No headaches, Difficulty swallowing,Tooth/dental problems,Sore throat,  No sneezing, itching, ear ache, nasal congestion, post nasal drip,  Cardio-vascular:  No chest pain, Orthopnea, PND, anasarca, dizziness, palpitations.no Bilateral lower extremity swelling  GI:  No heartburn, indigestion, abdominal pain, nausea, vomiting, diarrhea, change in bowel habits, loss of appetite, melena, blood in stool, hematemesis Resp:   No excess mucus, no productive cough, No non-productive cough, No coughing up of blood.No change in color of mucus.No wheezing. Skin:  no rash or lesions. No jaundice GU:  no dysuria, change in color of urine, no urgency or frequency. No straining to urinate.  No flank pain.  Musculoskeletal:  No joint pain or no joint swelling. No decreased range of motion. No back pain.  Psych:  No change in mood or affect. No depression or anxiety. No memory loss.  Neuro: no localizing neurological complaints, no tingling, no weakness, no double vision, no gait abnormality, no slurred speech, no confusion  All systems reviewed and apart from HOPI all are negative  Past Medical History:   Past Medical History:  Diagnosis Date   Tachycardia 07/05/2017   postpartum, spent 1 week  in hospital, no further diagnosis      Past Surgical History:  Procedure Laterality Date   BREAST SURGERY      Social History:  Ambulatory  independently       reports that she has never smoked. She has never used smokeless tobacco. No history on file for alcohol and drug.   Family History:   Family History  Problem Relation Age of  Onset   Hypertension Other     Allergies: No Known Allergies   Prior to Admission medications   Medication Sig Start Date End Date Taking? Authorizing Provider  aspirin EC 81 MG tablet Take 81 mg by mouth daily.   Yes [provider]  prenatal vitamin w/FE, FA (PRENATAL 1 + 1) 27-1 MG TABS tablet Take 1 tablet by mouth daily at 12 noon.   Yes [provider]   Physical Exam: Blood pressure 99/65, pulse (!) 111, temperature 97.9 F (36.6 C), temperature source Temporal, resp. rate (!) 22, SpO2 99 %. 1. General:  in  Acute distress increased work of breathing  complaining of   Pain    acutely ill -appearing 2. Psychological: Alert and   Oriented 3. Head/ENT:    Dry Mucous Membranes                          Head Non traumatic, neck supple                          Normal  Dentition 4. SKIN:  decreased Skin turgor,  Skin clean Dry and intact no rash 5. Heart: Regular rate and rhythm no Murmur, no Rub or gallop 6. Lungs: no wheezes or crackles   7. Abdomen: Soft,  non-tender,  Gravid bowel sounds present 8. Lower extremities: no clubbing, cyanosis, no  edema 9. Neurologically Grossly intact, moving all 4 extremities equally  10. MSK: Normal range of motion   All other LABS:     Recent Labs  Lab 06/08/19 1725  WBC 4.2  NEUTROABS 2.5  HGB 12.1  HCT 36.8  MCV 91.8  PLT 160     Recent Labs  Lab 06/08/19 1725  NA 140  K 3.6  CL 109  CO2 19*  GLUCOSE 80  BUN <5*  CREATININE 0.66  CALCIUM 8.7*     Recent Labs  Lab 06/08/19 1725  AST 14*  ALT 12  ALKPHOS 78  BILITOT 0.5  PROT 5.9*  ALBUMIN 2.5*       Cultures: No results found for: SDES, SPECREQUEST, CULT, REPTSTATUS   Radiological Exams on Admission: Ct Angio Chest Pe W And/or Wo Contrast  Result Date: 06/08/2019 CLINICAL DATA:  Shortness of breath, palpitations, decreased fetal movement, PE suspected, COVID-19 positive EXAM: CT ANGIOGRAPHY CHEST WITH CONTRAST TECHNIQUE: Multidetector  CT imaging of the chest was performed using the standard protocol during bolus administration of intravenous contrast. Multiplanar CT image reconstructions and MIPs were obtained to evaluate the vascular anatomy. CONTRAST:  177mL OMNIPAQUE IOHEXOL 350 MG/ML SOLN COMPARISON:  CTA chest 07/06/2017 FINDINGS: Cardiovascular: Suboptimal opacification of the pulmonary arteries with evaluation further complicated by respiratory motion in underlying parenchymal disease. No large central or lobar pulmonary arteries are clearly evident. Pulmonary trunk is upper limits normal of size. No elevation of the RV/LV ratio (0.8). Normal heart size. No pericardial effusion. Nonaneurysmal thoracic aorta with normal 3 vessel branching of the arch. No acute aortic abnormality is seen. Mediastinum/Nodes:  No mediastinal, hilar or axillary adenopathy. Thyroid gland and thoracic inlet are unremarkable. No acute abnormality of the trachea or esophagus. Lungs/Pleura: Multifocal areas of ground-glass opacity throughout the lungs are compatible with known diagnosis of COVID 19. No pneumothorax or effusion. No suspicious nodules or masses. Upper Abdomen: Included portions of the upper abdomen are unremarkable. Musculoskeletal: No acute osseous abnormality or suspicious osseous lesion. No suspicious chest wall lesions. Review of the MIP images confirms the above findings. IMPRESSION: Suboptimal opacification of the pulmonary arteries with evaluation further complicated by respiratory motion and underlying parenchymal disease. No large central or lobar pulmonary arteries are clearly evident. Multifocal areas of ground-glass opacity throughout the lungs are compatible with atypical infection and known diagnosis of COVID 19. Electronically Signed   By: Kreg Shropshire M.D.   On: 06/08/2019 21:34   Dg Chest Port 1 View  Result Date: 06/08/2019 CLINICAL DATA:  Shortness of breath, covid positive EXAM: PORTABLE CHEST 1 VIEW COMPARISON:  March 03, 2014  FINDINGS: The heart size and mediastinal contours are within normal limits. Shallow degree of aeration. No large airspace consolidation or pleural effusion. No acute osseous abnormality. IMPRESSION: No active disease. Electronically Signed   By: Jonna Clark M.D.   On: 06/08/2019 17:54    Chart has been reviewed    Assessment/Plan   23 y.o. female with medical history significant of COVID viral illness, [redacted] wks gestation,   Admitted for COVID 19 pneumonia and dehydration  Present on Admission:  Pneumonia due to COVID-19 virus -   FROM HOME WITH KNOWN HX OF COVID19 Initial Rapid Novel Corona Virus testing:  Ordered 06/08/19 and is  positive    - As per hx pt with evidence of  Cough: shortness of breath Severe fatigue    Following concerning LAB/ imaging findings:    CRP, LDH: increased     Procalcitonin: low      CT chest: GGO,        Plan of treatment: -Admit to [facility] as patient is currently pregnant -Hypoxia initiate steroids Decadron 4 mg every  24 hours And pharmacy consult for remdesivir - Will follow daily d.dimer, although elevated in pregnancy can continue to follow the trend it may be helpful  - Supportive management -Fluid sparing resuscitation  -Provide oxygen as needed currently on 2L SpO2: 96 %    Poor Prognostic factors  23 y.o.  Personal hx ofpregnanacy    Airborne precautions ordered     Will order Airborne and Contact precautions   Dehydration -resulting in tachycardia of mother and fetus.  Will rehydrate and monitor   Pregnancy and infectious disease in third trimester -appreciate OB/GYN consultation defer to them in  management of pregnancy Discussed with pharmacy safety of medications ordered pharmacy consulted for fetal safety  Other plan as per orders.  DVT prophylaxis:   Lovenox   Given hx of covdid  Code Status:  FULL CODE  Family Communication:   Family not at  Bedside   Disposition Plan:    To home once workup is complete and  patient is stable                                          Consults called:    OBGYN aware and following  Admission status:  ED Disposition    ED Disposition Condition Comment   Admit  The patient appears reasonably  stabilized for admission considering the current resources, flow, and capabilities available in the ED at this time, and I doubt any other [facility] requiring further screening and/or treatment in the ED prior to admission is  present.       Obs       Level of care   Tele  indefinitely please discontinue once patient no longer qualifies  Precautions:   Airborne and Contact precautions  PPE: Used by the provider:   P100  eye Goggles,  Gloves  gown      Marissa Andrade 06/09/2019, 12:02 AM    [facility]     after 2 AM please page floor coverage PA If 7AM-7PM, please contact the day team taking care of the patient using [email or web address]

## 2019-06-08 NOTE — Progress Notes (Signed)
Pt. Still in ED awaiting room assignment. Pt. States she is having ctx. ED RN connected toco and called [facility] RN. Diomedes.Brome RN spoke with Dr. Paulo Fruit and she advised for the patient FHR and TOCO to be monitored for the next hour to rule out preterm labor.   ED RN advised that patient hooked to purewick and pt. Able to void ~430ml. Pt. Advised that the pelvic pressure is better after that. [facility] RN on the way to monitor patient at this time.

## 2019-06-08 NOTE — Progress Notes (Signed)
Received notification from rapid response nurse of my patient in [place], covid+ and reporting SOB.  Advised pt will be undergoing CT scan, and admission is not yet decided on, however will go to 2W if is admitted.  RRN let me know that cont. monitoring is not possible on 2W.  She advised that initially baby was tachycardic but after fluid resusitation baseline normalized with moderate variability and accels, no decelerations were noted.  I advised that NST tid would be ok.  RRN also reported active FM.  Will await notification of medical admission.

## 2019-06-08 NOTE — ED Provider Notes (Signed)
[facility] EMERGENCY [facility]PARTMENT Provider Note   CSN: [id number] Arrival date & time: 06/08/19  1632     History   Chief Complaint Chief Complaint  Patient presents with  . Shortness of Breath  . COVID +  . Pregnant    HPI Marissa Andrade is a 23 y.o. female.     Pt presents to the ED today with sob and tachycardia.  The pt is + for Covid.  She has had sx for about 10 days.  The pt is also [redacted] weeks pregnant.  The pt has not had any fevers.  She is not sure how she caught Covid.     Past Medical History:  Diagnosis Date  . Tachycardia 07/05/2017   postpartum, spent 1 week in hospital, no further diagnosis    Patient Active Problem List   Diagnosis Date Noted  . Pneumonia due to COVID-19 virus 06/08/2019  . Pregnancy and infectious disease in third trimester 06/08/2019    Past Surgical History:  Procedure Laterality Date  . BREAST SURGERY       OB History    Gravida  2   Para  1   Term  0   Preterm  1   AB  0   Living  1     SAB  0   TAB  0   Ectopic  0   Multiple  0   Live Births  1            Home Medications    Prior to Admission medications   Medication Sig Start Date End Date Taking? Authorizing Provider  aspirin EC 81 MG tablet Take 81 mg by mouth daily.   Yes [provider]  prenatal vitamin w/FE, FA (PRENATAL 1 + 1) 27-1 MG TABS tablet Take 1 tablet by mouth daily at 12 noon.   Yes [provider]    Family History History reviewed. No pertinent family history.  Social History Social History   Tobacco Use  . Smoking status: Never Smoker  Substance Use Topics  . Alcohol use: Not on file  . Drug use: Not on file     Allergies   Patient has no known allergies.   Review of Systems Review of Systems  Respiratory: Positive for shortness of breath.   Cardiovascular: Positive for palpitations.  All other systems reviewed and are negative.    Physical Exam Updated Vital Signs  BP 99/65   Pulse (!) 111   Temp 97.9 F (36.6 C) (Temporal)   Resp (!) 22   SpO2 99%   Physical Exam Vitals signs and nursing note reviewed.  Constitutional:      Appearance: She is well-developed.  HENT:     Head: Normocephalic and atraumatic.     Mouth/Throat:     Mouth: Mucous membranes are moist.     Pharynx: Oropharynx is clear.  Eyes:     Extraocular Movements: Extraocular movements intact.     Pupils: Pupils are equal, round, and reactive to light.  Neck:     Musculoskeletal: Normal range of motion and neck supple.  Cardiovascular:     Rate and Rhythm: Regular rhythm. Tachycardia present.  Pulmonary:     Effort: Pulmonary effort is normal.     Breath sounds: Normal breath sounds.  Abdominal:     General: Bowel sounds are normal.     Palpations: Abdomen is soft.     Comments: +gravid abdomen  Musculoskeletal: Normal range of motion.  Skin:    General: Skin is warm.     Capillary Refill: Capillary refill takes less than 2 seconds.  Neurological:     General: No focal deficit present.     Mental Status: She is alert and oriented to person, place, and time.  Psychiatric:        Mood and Affect: Mood normal.        Behavior: Behavior normal.      ED Treatments / Results  Labs (all labs ordered are listed, but only abnormal results are displayed) Labs Reviewed  SARS CORONAVIRUS 2 BY RT PCR (HOSPITAL ORDER, PERFORMED IN [facility]) - Abnormal; Notable for the following components:      Result Value   SARS Coronavirus 2 POSITIVE (*)    All other components within normal limits  COMPREHENSIVE METABOLIC PANEL - Abnormal; Notable for the following components:   CO2 19 (*)    BUN <5 (*)    Calcium 8.7 (*)    Total Protein 5.9 (*)    Albumin 2.5 (*)    AST 14 (*)    All other components within normal limits  D-DIMER, QUANTITATIVE (NOT AT [facility]) - Abnormal; Notable for the following components:   D-Dimer, Quant 1.76 (*)    All other components  within normal limits  TRIGLYCERIDES - Abnormal; Notable for the following components:   Triglycerides 262 (*)    All other components within normal limits  FIBRINOGEN - Abnormal; Notable for the following components:   Fibrinogen 567 (*)    All other components within normal limits  URINALYSIS, ROUTINE W REFLEX MICROSCOPIC - Abnormal; Notable for the following components:   Color, Urine STRAW (*)    APPearance CLOUDY (*)    Ketones, ur 20 (*)    All other components within normal limits  CULTURE, BLOOD (ROUTINE X 2)  CULTURE, BLOOD (ROUTINE X 2)  LACTIC ACID, PLASMA  CBC WITH DIFFERENTIAL/PLATELET  PROCALCITONIN  LACTATE DEHYDROGENASE  LACTIC ACID, PLASMA  FERRITIN  C-REACTIVE PROTEIN  HIV ANTIBODY (ROUTINE TESTING W REFLEX)    EKG None  Radiology Ct Angio Chest Pe W And/or Wo Contrast  Result Date: 06/08/2019 CLINICAL DATA:  Shortness of breath, palpitations, decreased fetal movement, PE suspected, COVID-19 positive EXAM: CT ANGIOGRAPHY CHEST WITH CONTRAST TECHNIQUE: Multidetector CT imaging of the chest was performed using the standard protocol during bolus administration of intravenous contrast. Multiplanar CT image reconstructions and MIPs were obtained to evaluate the vascular anatomy. CONTRAST:  OMNIPAQUE IOHEXOL 350 MG/ML SOLN COMPARISON:  CTA chest 07/06/2017 FINDINGS: Cardiovascular: Suboptimal opacification of the pulmonary arteries with evaluation further complicated by respiratory motion in underlying parenchymal disease. No large central or lobar pulmonary arteries are clearly evident. Pulmonary trunk is upper limits normal of size. No elevation of the RV/LV ratio (0.8). Normal heart size. No pericardial effusion. Nonaneurysmal thoracic aorta with normal 3 vessel branching of the arch. No acute aortic abnormality is seen. Mediastinum/Nodes: No mediastinal, hilar or axillary adenopathy. Thyroid gland and thoracic inlet are unremarkable. No acute abnormality of the  trachea or esophagus. Lungs/Pleura: Multifocal areas of ground-glass opacity throughout the lungs are compatible with known diagnosis of COVID 19. No pneumothorax or effusion. No suspicious nodules or masses. Upper Abdomen: Included portions of the upper abdomen are unremarkable. Musculoskeletal: No acute osseous abnormality or suspicious osseous lesion. No suspicious chest wall lesions. Review of the MIP images confirms the above findings. IMPRESSION: Suboptimal opacification of the pulmonary arteries with evaluation further complicated by respiratory  motion and underlying parenchymal disease. No large central or lobar pulmonary arteries are clearly evident. Multifocal areas of ground-glass opacity throughout the lungs are compatible with atypical infection and known diagnosis of COVID 19. Electronically Signed   By: Kreg Shropshire M.D.   On: 06/08/2019 21:34   Dg Chest Port 1 View  Result Date: 06/08/2019 CLINICAL DATA:  Shortness of breath, covid positive EXAM: PORTABLE CHEST 1 VIEW COMPARISON:  March 03, 2014 FINDINGS: The heart size and mediastinal contours are within normal limits. Shallow degree of aeration. No large airspace consolidation or pleural effusion. No acute osseous abnormality. IMPRESSION: No active disease. Electronically Signed   By: Jonna Clark M.D.   On: 06/08/2019 17:54    Procedures Procedures (including critical care time)  Medications Ordered in ED Medications  0.9 %  sodium chloride infusion (1,000 mLs Intravenous New Bag/Given (Non-Interop) 06/08/19 1842)  cefTRIAXone (ROCEPHIN) 1 g in sodium chloride 0.9 % 100 mL IVPB (1 g Intravenous New Bag/Given 06/08/19 2216)  azithromycin (ZITHROMAX) 500 mg in sodium chloride 0.9 % 250 mL IVPB (500 mg Intravenous New Bag/Given 06/08/19 2215)  vitamin C (ASCORBIC ACID) tablet 500 mg (has no administration in time range)  zinc sulfate capsule 220 mg (has no administration in time range)  dexamethasone (DECADRON) injection 6 mg (has no  administration in time range)  folic acid (FOLVITE) tablet 1 mg (has no administration in time range)  acetaminophen (TYLENOL) tablet 1,000 mg (1,000 mg Oral Given 06/08/19 1726)  0.9 %  sodium chloride infusion ( Intravenous Stopped 06/08/19 1907)  iohexol (OMNIPAQUE) 350 MG/ML injection 100 mL (100 mLs Intravenous Contrast Given 06/08/19 2112)  dexamethasone (DECADRON) injection 10 mg (10 mg Intravenous Given 06/08/19 2207)     Initial Impression / Assessment and Plan / ED Course  I have reviewed the triage vital signs and the nursing notes.  Pertinent labs & imaging results that were available during my care of the patient were reviewed by me and considered in my medical decision making (see chart for details).       Rapid OB response called upon arrival.  Pt given IVFs for HR.  She is also placed on 2L oxygen via [place] for comfort.  Pt still persistently tachycardic.  The pt had a CT chest to r/o PE.  She does not have a PE, but does have multifocal areas of ground-glass opacities. The pt was given decadron, rocephin, and zithromax.    OB wants pt to stay here at [facility] [address] on 2W.  Then they will follow TID with fetal monitoring.  Pt d/w Dr. Adela Glimpse (triad) for admission.  CRITICAL CARE Performed by: Jacalyn Lefevre   Total critical care time: 30 minutes  Critical care time was exclusive of separately billable procedures and treating other patients.  Critical care was necessary to treat or prevent imminent or life-threatening deterioration.  Critical care was time spent personally by me on the following activities: development of treatment plan with patient and/or surrogate as well as nursing, discussions with consultants, evaluation of patient's response to treatment, examination of patient, obtaining history from patient or surrogate, ordering and performing treatments and interventions, ordering and review of laboratory studies, ordering and review of radiographic studies, pulse  oximetry and re-evaluation of patient's condition.  Final Clinical Impressions(s) / ED Diagnoses   Final diagnoses:  COVID-19 virus detected  [redacted] weeks gestation of pregnancy  Pneumonia due to COVID-19 virus  Tachycardia    ED Discharge Orders    None  Isla Pence, MD 06/08/19 2250

## 2019-06-08 NOTE — Progress Notes (Addendum)
64 Arrived to evaluate this 23 yo G2P1 @ [redacted] wks GA in with report of known COVID+ now with report of heart racing and decreased fetal movement.  Triage RN unable to assess FHTs. Upon my arrival pt is visibly SOB with tachypnea and increased work of breathing and is tachycardic on the monitor.  1709 FHR 200bpm.  1714 1L IV NS bolus started. 1730 FHR slowly returned to normal range with improved variability, no UCs noted. Pt reports decreased urination and concentrated urine today despite trying to drink more fluid.1740 FHR Category I, no UCs noted. Dr. Rogue Bussing notified of pt in ED and of pending CXR and labs and of above. Requests continued fetal monitoring while awaiting x-ray and lab reports and pending decision for medical admission or discharge home. Requests OBRR RN call back once results and disposition are known.1610 Dr. Rogue Bussing updated on results and plans for CT to r/o PE and on probable admission due to tachycardia, tachypnea and increased work of breathing with increases in all measurements when moving around or getting out of bed.  Per Dr. Rogue Bussing continuous EFM can be discontinued and orders for OB RR RN to perform TID EFM if pt is medically admitted.

## 2019-06-08 NOTE — Progress Notes (Signed)
Pharmacy Note  23yo pregnant female w/ known + Covid and sx x10d, CT reveals multifocal areas of ground-glass opacity, tachycardic, O2 98% on RA though now on supplemental O2 for comfort, to begin remdesivir.  Plan: Remdesivir 200mg  IV x1 followed by 100mg  IV Q24H.  Wynona Neat, PharmD, BCPS 06/08/2019 11:16 PM

## 2019-06-08 NOTE — ED Triage Notes (Signed)
Pt arrives via POV from home with known COVID+, 28 weeks pregant. Comes in because her heart was racing at home last night , decreased fetal movement. Unable to assess FHTs in triage. Will page page OB rapid response and move to room.

## 2019-06-09 ENCOUNTER — Other Ambulatory Visit: Payer: Self-pay

## 2019-06-09 DIAGNOSIS — E860 Dehydration: Secondary | ICD-10-CM | POA: Diagnosis present

## 2019-06-09 DIAGNOSIS — R000 Tachycardia, unspecified: Secondary | ICD-10-CM | POA: Diagnosis present

## 2019-06-09 DIAGNOSIS — O99013 Anemia complicating pregnancy, third trimester: Secondary | ICD-10-CM | POA: Diagnosis present

## 2019-06-09 DIAGNOSIS — O98813 Other maternal infectious and parasitic diseases complicating pregnancy, third trimester: Secondary | ICD-10-CM

## 2019-06-09 DIAGNOSIS — U071 COVID-19: Secondary | ICD-10-CM | POA: Diagnosis present

## 2019-06-09 DIAGNOSIS — O98513 Other viral diseases complicating pregnancy, third trimester: Secondary | ICD-10-CM | POA: Diagnosis present

## 2019-06-09 DIAGNOSIS — D649 Anemia, unspecified: Secondary | ICD-10-CM | POA: Diagnosis present

## 2019-06-09 DIAGNOSIS — R0902 Hypoxemia: Secondary | ICD-10-CM | POA: Diagnosis present

## 2019-06-09 DIAGNOSIS — J1289 Other viral pneumonia: Secondary | ICD-10-CM | POA: Diagnosis present

## 2019-06-09 DIAGNOSIS — O368130 Decreased fetal movements, third trimester, not applicable or unspecified: Secondary | ICD-10-CM | POA: Diagnosis present

## 2019-06-09 DIAGNOSIS — Z3A28 28 weeks gestation of pregnancy: Secondary | ICD-10-CM | POA: Diagnosis not present

## 2019-06-09 DIAGNOSIS — O99513 Diseases of the respiratory system complicating pregnancy, third trimester: Secondary | ICD-10-CM | POA: Diagnosis present

## 2019-06-09 DIAGNOSIS — O99283 Endocrine, nutritional and metabolic diseases complicating pregnancy, third trimester: Secondary | ICD-10-CM | POA: Diagnosis present

## 2019-06-09 LAB — COMPREHENSIVE METABOLIC PANEL
ALT: 13 U/L (ref 0–44)
AST: 15 U/L (ref 15–41)
Albumin: 2.4 g/dL — ABNORMAL LOW (ref 3.5–5.0)
Alkaline Phosphatase: 86 U/L (ref 38–126)
Anion gap: 10 (ref 5–15)
BUN: <5 mg/dL — ABNORMAL LOW (ref 6–20)
CO2: 17 mmol/L — ABNORMAL LOW (ref 22–32)
Calcium: 8.2 mg/dL — ABNORMAL LOW (ref 8.9–10.3)
Chloride: 112 mmol/L — ABNORMAL HIGH (ref 98–111)
Creatinine, Ser: 0.56 mg/dL (ref 0.44–1.00)
GFR calc Af Amer: >60 mL/min (ref >60)
GFR calc non Af Amer: >60 mL/min (ref >60)
Glucose, Bld: 158 mg/dL — ABNORMAL HIGH (ref 70–99)
Potassium: 4.1 mmol/L (ref 3.5–5.1)
Sodium: 139 mmol/L (ref 135–145)
Total Bilirubin: 0.3 mg/dL (ref 0.3–1.2)
Total Protein: 5.7 g/dL — ABNORMAL LOW (ref 6.5–8.1)

## 2019-06-09 LAB — HIV ANTIBODY (ROUTINE TESTING W REFLEX): HIV Screen 4th Generation wRfx: NONREACTIVE

## 2019-06-09 LAB — CBC WITH DIFFERENTIAL/PLATELET
Abs Immature Granulocytes: 0.04 10*3/uL (ref 0.00–0.07)
Basophils Absolute: 0 10*3/uL (ref 0.0–0.1)
Basophils Relative: 0 %
Eosinophils Absolute: 0 10*3/uL (ref 0.0–0.5)
Eosinophils Relative: 0 %
HCT: 34 % — ABNORMAL LOW (ref 36.0–46.0)
Hemoglobin: 11 g/dL — ABNORMAL LOW (ref 12.0–15.0)
Immature Granulocytes: 1 %
Lymphocytes Relative: 29 %
Lymphs Abs: 1.1 10*3/uL (ref 0.7–4.0)
MCH: 30.5 pg (ref 26.0–34.0)
MCHC: 32.4 g/dL (ref 30.0–36.0)
MCV: 94.2 fL (ref 80.0–100.0)
Monocytes Absolute: 0.2 10*3/uL (ref 0.1–1.0)
Monocytes Relative: 5 %
Neutro Abs: 2.5 10*3/uL (ref 1.7–7.7)
Neutrophils Relative %: 65 %
Platelets: 167 10*3/uL (ref 150–400)
RBC: 3.61 MIL/uL — ABNORMAL LOW (ref 3.87–5.11)
RDW: 13.4 % (ref 11.5–15.5)
WBC: 3.8 10*3/uL — ABNORMAL LOW (ref 4.0–10.5)
nRBC: 0 % (ref 0.0–0.2)

## 2019-06-09 LAB — C-REACTIVE PROTEIN: CRP: 3.8 mg/dL — ABNORMAL HIGH (ref <1.0)

## 2019-06-09 LAB — ABO/RH: ABO/RH(D): O NEG

## 2019-06-09 LAB — FERRITIN: Ferritin: 15 ng/mL (ref 11–307)

## 2019-06-09 LAB — ANTIBODY SCREEN: Antibody Screen: NEGATIVE

## 2019-06-09 LAB — MAGNESIUM: Magnesium: 1.5 mg/dL — ABNORMAL LOW (ref 1.7–2.4)

## 2019-06-09 LAB — D-DIMER, QUANTITATIVE: D-Dimer, Quant: 1.38 ug/mL-FEU — ABNORMAL HIGH (ref 0.00–0.50)

## 2019-06-09 MED ORDER — MAGNESIUM SULFATE 2 GM/50ML IV SOLN
2.0000 g | Freq: Once | INTRAVENOUS | Status: AC
  Administered 2019-06-09: 2 g via INTRAVENOUS
  Filled 2019-06-09 (×2): qty 50

## 2019-06-09 NOTE — Progress Notes (Signed)
Saw patient - she is doing better and breathing easier.  Eating a little. She is not having any bleeding, leaking fluid or contracting.  She reports good FM.  D/w pt signs and symptoms that she should alert the nurse to regarding the pregnancy, especially decreased fetal movement.  Pt voiced understanding.  Pt received Rhogam.  She is on Decadron (dexamethasone) and will not need additional steroids for fetal lung maturity.

## 2019-06-09 NOTE — Progress Notes (Signed)
Spoke with Dr. Philis Pique regarding pt POC. Waiting for room on 2W.

## 2019-06-09 NOTE — Progress Notes (Signed)
23 y.o. [id number] [redacted]w[redacted]d who previously reported to OB that she tested Covid 19 positive at her PCP.  Pt at that time had no symptoms and was to return to office at 29 5/7 weeks for her 1 hour GTT and Rhogam shot.  Pt since then has developed severe respiratory symptoms and is being admitted for presumed Covid 19 disease.    Vitals:   06/09/19 0445 06/09/19 0500 06/09/19 0715 06/09/19 0800  BP: 110/72 104/73 114/71 (!) 137/59  Pulse: (!) 106 (!) 110 (!) 114 (!) 113  Resp: (!) 24 (!) 24 (!) 22 15  Temp:      TempSrc:      SpO2: 97% 98% 96% 97%   FHTs reported by Rapid Response Nurse to be Cat 1 with no contractions noted.   SVE not performed yet.  OB Assessment:  [redacted]w[redacted]d with presumed Covid 19 disease and need for medical treatment.  Currently pt is stable from OB standpoint and we will continue to monitor.    Plan:   1. Monitor FHTS TID.   2. Pt is O negative and has not yet received her Rhogam- will order antibody screen and Rhogam if negative. 3. Pt has not had her 1 hour GTT yet- will need to respond to her sugars as medically indicated. 4. Pt should receive betamethasone 12.5 mg x 2 doses for fetal lung maturity in case of need for delivery secondary fetal indications or if she goes into preterm labor.  If she is to receive high dose steroid protocol, may still need BMZ if she is getting prednisone as we know this does not cross the placenta.  May need to consult MFM if another type of steroid is used.  5. High dose steroids are not contraindicated in pregnancy.  Remdesivir, although not extensively studied, does not appear to cause adverse fetal effects and in any case should not be withheld if it will benefit the mother's health.  6.  Delivery of fetus should be only for lack of fetal wellbeing indications.  Cervical check is needed only in the case of suspected preterm labor.  7.  If pregnancy is stable, we may elect to manage this patient remotely since the Rapid Response Nurse will be  monitoring fetus, in an effort to decrease our exposure to the novel coronovirus.  Thank you for your help on this patient.    Please call [facility] (or check AMION for [facility] OB[facility] on call doc) for any questions or concerns.    Daria Pastures

## 2019-06-09 NOTE — Plan of Care (Signed)

## 2019-06-09 NOTE — Progress Notes (Signed)
To ED to assess patient. At this time she is being transferred to [place] room 236.   Pt denies any contractions, LOF, VB. Reports + FM.   Will do NST in new room.

## 2019-06-09 NOTE — Progress Notes (Signed)
OBRR at bedside. Pt denies any contractions, LOF, bleeding. +FM reported. NST +accels, -decels, -contractions, category I FHR.

## 2019-06-09 NOTE — Progress Notes (Signed)
Patient transferred to room from ED with SOB noted with transfer. Alert and oriented x4. Placed on monitor. OB RN in to assess patient.

## 2019-06-09 NOTE — ED Notes (Signed)
Pt moved over onto hospital bed. Pt did get extremely shortness upon standing to get into new bed. Pt placed on 2L [place] and now resting comfortably once settled in her new bed.

## 2019-06-09 NOTE — ED Notes (Signed)
SDU  Breakfast ordered  

## 2019-06-09 NOTE — Progress Notes (Signed)
Pt. Category I tracing, no ctx noted via palpation or toco. Pt. More comfortable now that her bladder is emptied. Dr. Rogue Bussing notified and ok with d/c FHR monitoring at this time. Pt. Holding in ED until inpatient bed available.

## 2019-06-09 NOTE — Progress Notes (Addendum)
PROGRESS NOTE  Mathis Dad DUK:[id number] DOB: Dec 19, 1995 DOA: 06/08/2019 PCP: Patient, No Pcp Per  HPI/Recap of past 24 hours: Marissa Andrade is a 23 y.o. female with no significant medical history, currently [redacted] wks gestation, presents to the ED complaining of shortness of breath, cough, tachycardia, that has been ongoing for about 10 days, decided to come to the ER due to worsening palpitations, and decreased fetal movements.  Patient reports she has been isolating herself, and her 52-year-old son.  Patient tested positive at her PCPs office, remained asymptomatic for a couple of days, but symptoms worsened.  In the ED, patient was significantly short of breath, tachypneic, with increased work of breathing, patient was saturating above 90 on room air, was noted to be tachycardic with heart rate in the 120s, afebrile, reported some possible contractions of which OB was consulted. Covid 19 test was positive, labs showed slightly elevated inflammatory markers.  Patient admitted for further management.     Today, patient reports feeling okay, still short of breath, especially upon ambulation, still coughing, no documented fever, denies any chest pain, abdominal pain, nausea/vomiting, diarrhea, dysuria.   Assessment/Plan: Active Problems:   Pneumonia due to COVID-19 virus   Pregnancy and infectious disease in third trimester  Pneumonia due to COVID-19 virus Currently afebrile, with no leukocytosis, tachycardic Currently still short of breath, worse on exertion, with sats dropping to the low 90s Inflammatory markers elevated Procalcitonin negative Chest x-ray showed no active disease CTA chest showed multifocal areas of groundglass opacity throughout the lungs compatible with COVID-19, no large central or lobar pulmonary emboli, although suboptimal opacification of the pulmonary arteries due to significant respiratory motion and underlying parenchymal disease Start Decadron, remdesivir Trend  inflammatory markers  Hypomagnesemia Replace as needed  [redacted] weeks gestation Spoke to Dr. Philis Pique on 06/09/2019, who stated patient will be well managed on the medicine floor (2W) instead of an OB floor given primary condition was pneumonia due to COVID-19.  She reassured me that patient will be seen on a daily basis, as well as 3 times daily by the RN for fetal monitoring.  Also ED rapid response OB RN will be available for any OB concerns/emergencies.  RN on 2W are aware and would call for any OB related concerns Further management by OB/GYN        Malnutrition Type:      Malnutrition Characteristics:      Nutrition Interventions:       Estimated body mass index is 39.48 kg/m as calculated from the following:   Height as of this encounter: 5\' 4"  (1.626 m).   Weight as of this encounter: 104.3 kg.     Code Status: Full  Family Communication: None at bedside  Disposition Plan: To be determined, likely home   Consultants:  OB/GYN  Procedures:  None  Antimicrobials:  None  DVT prophylaxis: Lovenox   Objective: Vitals:   06/09/19 1000 06/09/19 1100 06/09/19 1200 06/09/19 1451  BP: 120/71 109/63 109/63 109/73  Pulse: (!) 114 (!) 107 (!) 116 (!) 113  Resp: (!) 21 20 20  (!) 23  Temp:    97.8 F (36.6 C)  TempSrc:    Oral  SpO2: 99% 97% 98% 98%  Weight:    104.3 kg  Height:    5\' 4"  (1.626 m)   No intake or output data in the 24 hours ending 06/09/19 1454 Filed Weights   06/09/19 1451  Weight: 104.3 kg    Exam:  General: NAD, ill-appearing  Cardiovascular: S1, S2 present  Respiratory: CTAB  Abdomen: Soft, nontender, gravid, bowel sounds present  Musculoskeletal: No bilateral pedal edema noted  Skin: Normal  Psychiatry: Normal mood    Data Reviewed: CBC: Recent Labs  Lab 06/08/19 1725 06/09/19 0451  WBC 4.2 3.8*  NEUTROABS 2.5 2.5  HGB 12.1 11.0*  HCT 36.8 34.0*  MCV 91.8 94.2  PLT 160 167   Basic Metabolic Panel:  Recent Labs  Lab 06/08/19 1725 06/09/19 0451  NA 140 139  K 3.6 4.1  CL 109 112*  CO2 19* 17*  GLUCOSE 80 158*  BUN <5* <5*  CREATININE 0.66 0.56  CALCIUM 8.7* 8.2*  MG  --  1.5*   GFR: Estimated Creatinine Clearance: 127.5 mL/min (by C-G formula based on SCr of 0.56 mg/dL). Liver Function Tests: Recent Labs  Lab 06/08/19 1725 06/09/19 0451  AST 14* 15  ALT 12 13  ALKPHOS 78 86  BILITOT 0.5 0.3  PROT 5.9* 5.7*  ALBUMIN 2.5* 2.4*   No results for input(s): LIPASE, AMYLASE in the last 168 hours. No results for input(s): AMMONIA in the last 168 hours. Coagulation Profile: No results for input(s): INR, PROTIME in the last 168 hours. Cardiac Enzymes: No results for input(s): CKTOTAL, CKMB, CKMBINDEX, TROPONINI in the last 168 hours. BNP (last 3 results) No results for input(s): PROBNP in the last 8760 hours. HbA1C: No results for input(s): HGBA1C in the last 72 hours. CBG: No results for input(s): GLUCAP in the last 168 hours. Lipid Profile: Recent Labs    06/08/19 1725  TRIG 262*   Thyroid Function Tests: No results for input(s): TSH, T4TOTAL, FREET4, T3FREE, THYROIDAB in the last 72 hours. Anemia Panel: Recent Labs    06/08/19 1725 06/09/19 0959  FERRITIN 14 15   Urine analysis:    Component Value Date/Time   COLORURINE STRAW (A) 06/08/2019 1900   APPEARANCEUR CLOUDY (A) 06/08/2019 1900   LABSPEC 1.006 06/08/2019 1900   PHURINE 7.0 06/08/2019 1900   GLUCOSEU NEGATIVE 06/08/2019 1900   HGBUR NEGATIVE 06/08/2019 1900   BILIRUBINUR NEGATIVE 06/08/2019 1900   KETONESUR 20 (A) 06/08/2019 1900   PROTEINUR NEGATIVE 06/08/2019 1900   NITRITE NEGATIVE 06/08/2019 1900   LEUKOCYTESUR NEGATIVE 06/08/2019 1900   Sepsis Labs: @LABRCNTIP (procalcitonin:4,lacticidven:4)  ) Recent Results (from the past 240 hour(s))  Blood Culture (routine x 2)     Status: None (Preliminary result)   Collection Time: 06/08/19  5:25 PM   Specimen: BLOOD  Result Value Ref Range  Status   Specimen Description BLOOD RIGHT ANTECUBITAL  Final   Special Requests   Final    BOTTLES DRAWN AEROBIC AND ANAEROBIC Blood Culture adequate volume   Culture   Final    NO GROWTH < 24 HOURS Performed at [facility], [address], [place], [place]    Report Status PENDING  Incomplete  SARS Coronavirus 2 by RT PCR (hospital order, performed in [facility] hospital lab) Nasopharyngeal Nasopharyngeal Swab     Status: Abnormal   Collection Time: 06/08/19  5:38 PM   Specimen: Nasopharyngeal Swab  Result Value Ref Range Status   SARS Coronavirus 2 POSITIVE (A) NEGATIVE Final    Comment: RESULT CALLED TO, READ BACK BY AND VERIFIED WITH: C GROSE RN 06/08/19 1840 JDW (NOTE) If result is NEGATIVE SARS-CoV-2 target nucleic acids are NOT DETECTED. The SARS-CoV-2 RNA is generally detectable in upper and lower  respiratory specimens during the acute phase of infection. The lowest  concentration of  SARS-CoV-2 viral copies this assay can detect is 250  copies / mL. A negative result does not preclude SARS-CoV-2 infection  and should not be used as the sole basis for treatment or other  patient management decisions.  A negative result may occur with  improper specimen collection / handling, submission of specimen other  than nasopharyngeal swab, presence of viral mutation(s) within the  areas targeted by this assay, and inadequate number of viral copies  (<250 copies / mL). A negative result must be combined with clinical  observations, patient history, and epidemiological information. If result is POSITIVE SARS-CoV-2 target nucleic acids are DETECTED. The SARS -CoV-2 RNA is generally detectable in upper and lower  respiratory specimens during the acute phase of infection.  Positive  results are indicative of active infection with SARS-CoV-2.  Clinical  correlation with patient history and other diagnostic information is  necessary to determine patient infection status.   Positive results do  not rule out bacterial infection or co-infection with other viruses. If result is PRESUMPTIVE POSTIVE SARS-CoV-2 nucleic acids MAY BE PRESENT.   A presumptive positive result was obtained on the submitted specimen  and confirmed on repeat testing.  While 2019 novel coronavirus  (SARS-CoV-2) nucleic acids may be present in the submitted sample  additional confirmatory testing may be necessary for epidemiological  and / or clinical management purposes  to differentiate between  SARS-CoV-2 and other Sarbecovirus currently known to infect humans.  If clinically indicated additional testing with an alternate test  methodology [phone]([id number]) is advise d. The SARS-CoV-2 RNA is generally  detectable in upper and lower respiratory specimens during the acute  phase of infection. The expected result is Negative. Fact Sheet for Patients:  BoilerBrush.com.cyhttps://www.fda.gov/media/136312/download Fact Sheet for Healthcare Providers: https://pope.com/https://www.fda.gov/media/136313/download This test is not yet approved or cleared by the [place] FDA and has been authorized for detection and/or diagnosis of SARS-CoV-2 by FDA under an Emergency Use Authorization (EUA).  This EUA will remain in effect (meaning this test can be used) for the duration of the COVID-19 declaration under Section 564(b)(1) of the Act, 21 U.S.C. section 360bbb-3(b)(1), unless the authorization is terminated or revoked sooner. Performed at [facility], [address] [place], [place] [address]       Studies: Ct Angio Chest Pe W And/or Wo Contrast  Result Date: 06/08/2019 CLINICAL DATA:  Shortness of breath, palpitations, decreased fetal movement, PE suspected, COVID-19 positive EXAM: CT ANGIOGRAPHY CHEST WITH CONTRAST TECHNIQUE: Multidetector CT imaging of the chest was performed using the standard protocol during bolus administration of intravenous contrast. Multiplanar CT image reconstructions and MIPs were obtained to  evaluate the vascular anatomy. CONTRAST:  100mL OMNIPAQUE IOHEXOL 350 MG/ML SOLN COMPARISON:  CTA chest 07/06/2017 FINDINGS: Cardiovascular: Suboptimal opacification of the pulmonary arteries with evaluation further complicated by respiratory motion in underlying parenchymal disease. No large central or lobar pulmonary arteries are clearly evident. Pulmonary trunk is upper limits normal of size. No elevation of the RV/LV ratio (0.8). Normal heart size. No pericardial effusion. Nonaneurysmal thoracic aorta with normal 3 vessel branching of the arch. No acute aortic abnormality is seen. Mediastinum/Nodes: No mediastinal, hilar or axillary adenopathy. Thyroid gland and thoracic inlet are unremarkable. No acute abnormality of the trachea or esophagus. Lungs/Pleura: Multifocal areas of ground-glass opacity throughout the lungs are compatible with known diagnosis of COVID 19. No pneumothorax or effusion. No suspicious nodules or masses. Upper Abdomen: Included portions of the upper abdomen are unremarkable. Musculoskeletal: No acute osseous abnormality or  suspicious osseous lesion. No suspicious chest wall lesions. Review of the MIP images confirms the above findings. IMPRESSION: Suboptimal opacification of the pulmonary arteries with evaluation further complicated by respiratory motion and underlying parenchymal disease. No large central or lobar pulmonary arteries are clearly evident. Multifocal areas of ground-glass opacity throughout the lungs are compatible with atypical infection and known diagnosis of COVID 19. Electronically SignedKreg Shropshireice  DeHay M.D.   On: 06/08/2019 21:34   Dg Chest Port 1 View  Result Date: 06/08/2019 CLINICAL DATA:  Shortness of breath, covid positive EXAM: PORTABLE CHEST 1 VIEW COMPARISON:  March 03, 2014 FINDINGS: The heart size and mediastinal contours are within normal limits. Shallow degree of aeration. No large airspace consolidation or pleural effusion. No acute osseous  abnormality. IMPRESSION: No active disease. Electronically Signed   By: Jonna Clark M.D.   On: 06/08/2019 17:54    Scheduled Meds: . dexamethasone (DECADRON) injection  6 mg Intravenous Q24H  . enoxaparin (LOVENOX) injection  40 mg Subcutaneous Q24H  . folic acid  1 mg Oral Daily  . sodium chloride flush  3 mL Intravenous Q12H  . vitamin C  500 mg Oral Daily  . zinc sulfate  220 mg Oral Daily    Continuous Infusions: . sodium chloride 100 mL/hr at 06/09/19 1450  . remdesivir 100 mg in NS 250 mL       LOS: 0 days     Briant Cedar, MD [facility]  If 7PM-7AM, please contact night-coverage [email or web address] 06/09/2019, 2:54 PM

## 2019-06-09 NOTE — Progress Notes (Signed)
NST reactive and reassuring. Pt doing well, denies any needs.   If patient reports any OB needs please do not hesitate to reach out to [facility] at [phone] or call L&D at [phone].

## 2019-06-09 NOTE — ED Notes (Signed)
Lunch Tray Ordered @ 1053.  

## 2019-06-10 LAB — CBC WITH DIFFERENTIAL/PLATELET
Abs Immature Granulocytes: 0.05 10*3/uL (ref 0.00–0.07)
Basophils Absolute: 0 10*3/uL (ref 0.0–0.1)
Basophils Relative: 0 %
Eosinophils Absolute: 0 10*3/uL (ref 0.0–0.5)
Eosinophils Relative: 0 %
HCT: 28.9 % — ABNORMAL LOW (ref 36.0–46.0)
Hemoglobin: 9.5 g/dL — ABNORMAL LOW (ref 12.0–15.0)
Immature Granulocytes: 1 %
Lymphocytes Relative: 27 %
Lymphs Abs: 0.9 10*3/uL (ref 0.7–4.0)
MCH: 30.5 pg (ref 26.0–34.0)
MCHC: 32.9 g/dL (ref 30.0–36.0)
MCV: 92.9 fL (ref 80.0–100.0)
Monocytes Absolute: 0.2 10*3/uL (ref 0.1–1.0)
Monocytes Relative: 6 %
Neutro Abs: 2.3 10*3/uL (ref 1.7–7.7)
Neutrophils Relative %: 66 %
Platelets: 173 10*3/uL (ref 150–400)
RBC: 3.11 MIL/uL — ABNORMAL LOW (ref 3.87–5.11)
RDW: 13.5 % (ref 11.5–15.5)
WBC: 3.5 10*3/uL — ABNORMAL LOW (ref 4.0–10.5)
nRBC: 0 % (ref 0.0–0.2)

## 2019-06-10 LAB — COMPREHENSIVE METABOLIC PANEL
ALT: 15 U/L (ref 0–44)
AST: 18 U/L (ref 15–41)
Albumin: 2.1 g/dL — ABNORMAL LOW (ref 3.5–5.0)
Alkaline Phosphatase: 67 U/L (ref 38–126)
Anion gap: 9 (ref 5–15)
BUN: <5 mg/dL — ABNORMAL LOW (ref 6–20)
CO2: 19 mmol/L — ABNORMAL LOW (ref 22–32)
Calcium: 8.1 mg/dL — ABNORMAL LOW (ref 8.9–10.3)
Chloride: 113 mmol/L — ABNORMAL HIGH (ref 98–111)
Creatinine, Ser: 0.51 mg/dL (ref 0.44–1.00)
GFR calc Af Amer: >60 mL/min (ref >60)
GFR calc non Af Amer: >60 mL/min (ref >60)
Glucose, Bld: 142 mg/dL — ABNORMAL HIGH (ref 70–99)
Potassium: 3.8 mmol/L (ref 3.5–5.1)
Sodium: 141 mmol/L (ref 135–145)
Total Bilirubin: 0.6 mg/dL (ref 0.3–1.2)
Total Protein: 5 g/dL — ABNORMAL LOW (ref 6.5–8.1)

## 2019-06-10 LAB — MAGNESIUM: Magnesium: 1.7 mg/dL (ref 1.7–2.4)

## 2019-06-10 LAB — D-DIMER, QUANTITATIVE: D-Dimer, Quant: 1.54 ug/mL-FEU — ABNORMAL HIGH (ref 0.00–0.50)

## 2019-06-10 LAB — C-REACTIVE PROTEIN: CRP: 2.5 mg/dL — ABNORMAL HIGH (ref <1.0)

## 2019-06-10 LAB — FERRITIN: Ferritin: 14 ng/mL (ref 11–307)

## 2019-06-10 MED ORDER — ENOXAPARIN SODIUM 60 MG/0.6ML ~~LOC~~ SOLN
50.0000 mg | SUBCUTANEOUS | Status: DC
  Administered 2019-06-10 – 2019-06-12 (×3): 50 mg via SUBCUTANEOUS
  Filled 2019-06-10 (×3): qty 0.6

## 2019-06-10 MED ORDER — RHO D IMMUNE GLOBULIN 1500 UNIT/2ML IJ SOSY
300.0000 ug | PREFILLED_SYRINGE | Freq: Once | INTRAMUSCULAR | Status: DC
  Filled 2019-06-10: qty 2

## 2019-06-10 MED ORDER — RHO D IMMUNE GLOBULIN 1500 UNIT/2ML IJ SOSY
300.0000 ug | PREFILLED_SYRINGE | Freq: Once | INTRAMUSCULAR | Status: AC
  Administered 2019-06-10: 300 ug via INTRAMUSCULAR
  Filled 2019-06-10: qty 2

## 2019-06-10 NOTE — Progress Notes (Signed)
PROGRESS NOTE  Marissa Andrade AOZ:[id number] DOB: March 21, 1996 DOA: 06/08/2019 PCP: Patient, No Pcp Per  HPI/Recap of past 24 hours: Marissa Andrade is a 23 y.o. female with no significant medical history, currently [redacted] wks gestation, presents to the ED complaining of shortness of breath, cough, tachycardia, that has been ongoing for about 10 days, decided to come to the ER due to worsening palpitations, and decreased fetal movements.  Patient reports she has been isolating herself, and her 25-year-old son.  Patient tested positive at her PCPs office, remained asymptomatic for a couple of days, but symptoms worsened.  In the ED, patient was significantly short of breath, tachypneic, with increased work of breathing, patient was saturating above 90 on room air, was noted to be tachycardic with heart rate in the 120s, afebrile, reported some possible contractions of which OB was consulted. Covid 19 test was positive, labs showed slightly elevated inflammatory markers.  Patient admitted for further management.     Today, patient seen and examined at bedside, denies any worsening cough or shortness of breath, reports she is slowly improving.  Denies any chest pain, abdominal pain, nausea/vomiting, fever/chills, diarrhea.   Assessment/Plan: Active Problems:   Pneumonia due to COVID-19 virus   Pregnancy and infectious disease in third trimester  Pneumonia due to COVID-19 virus Currently afebrile, with no leukocytosis Currently still requiring 2 L of oxygen Inflammatory markers elevated Procalcitonin negative Chest x-ray showed no active disease CTA chest showed multifocal areas of groundglass opacity throughout the lungs compatible with COVID-19, no large central or lobar pulmonary emboli, although suboptimal opacification of the pulmonary arteries due to significant respiratory motion and underlying parenchymal disease Continue Decadron, remdesivir Trend inflammatory markers  Hypomagnesemia  Replace as needed  [redacted] weeks gestation Spoke to Dr. Henderson Cloud on 06/09/2019, who stated patient will be well managed on the medicine floor (2W) instead of an OB floor given primary condition was pneumonia due to COVID-19.  She reassured me that patient will be seen on a daily basis, as well as 3 times daily by the RN for fetal monitoring.  Also ED rapid response OB RN will be available for any OB concerns/emergencies.  RN on 2W are aware and would call for any OB related concerns Further management by OB/GYN        Malnutrition Type:      Malnutrition Characteristics:      Nutrition Interventions:       Estimated body mass index is 39.48 kg/m as calculated from the following:   Height as of this encounter:  (1.626 m).   Weight as of this encounter: 104.3 kg.     Code Status: Full  Family Communication: None at bedside  Disposition Plan: To be determined, likely home   Consultants:  OB/GYN  Procedures:  None  Antimicrobials:  None  DVT prophylaxis: Lovenox   Objective: Vitals:   06/09/19 2326 06/10/19 0411 06/10/19 0900 06/10/19 1313  BP: 95/75 104/71 120/66 (!) 124/28  Pulse: 89 98 88 85  Resp: Temp: 98.2 F (36.8 C)   97.8 F (36.6 C)  TempSrc: Oral   Oral  SpO2: 98% 97% 98% 97%  Weight:      Height:        Intake/Output Summary (Last 24 hours) at 06/10/2019 1611 Last data filed at 06/10/2019 0432 Gross per 24 hour  Intake 3226.96 ml  Output -  Net 3226.96 ml   Filed Weights   06/09/19 1451  Weight: 104.3 kg  Exam:  General: NAD   Cardiovascular: S1, S2 present  Respiratory: CTAB  Abdomen: Soft, nontender, gravid, bowel sounds present  Musculoskeletal: No bilateral pedal edema noted  Skin: Normal  Psychiatry: Normal mood     Data Reviewed: CBC: Recent Labs  Lab 06/08/19 1725 06/09/19 0451 06/10/19 0404  WBC 4.2 3.8* 3.5*  NEUTROABS 2.5 2.5 2.3  HGB 12.1 11.0* 9.5*  HCT 36.8 34.0* 28.9*   MCV 91.8 94.2 92.9  PLT 160 167 173   Basic Metabolic Panel: Recent Labs  Lab 06/08/19 1725 06/09/19 0451 06/10/19 0404  NA 140 139 141  K 3.6 4.1 3.8  CL 109 112* 113*  CO2 19* 17* 19*  GLUCOSE 80 158* 142*  BUN <5* <5* <5*  CREATININE 0.66 0.56 0.51  CALCIUM 8.7* 8.2* 8.1*  MG  --  1.5* 1.7   GFR: Estimated Creatinine Clearance: 127.5 mL/min (by C-G formula based on SCr of 0.51 mg/dL). Liver Function Tests: Recent Labs  Lab 06/08/19 1725 06/09/19 0451 06/10/19 0404  AST 14* 15 18  ALT 12 13 15   ALKPHOS 78 86 67  BILITOT 0.5 0.3 0.6  PROT 5.9* 5.7* 5.0*  ALBUMIN 2.5* 2.4* 2.1*   No results for input(s): LIPASE, AMYLASE in the last 168 hours. No results for input(s): AMMONIA in the last 168 hours. Coagulation Profile: No results for input(s): INR, PROTIME in the last 168 hours. Cardiac Enzymes: No results for input(s): CKTOTAL, CKMB, CKMBINDEX, TROPONINI in the last 168 hours. BNP (last 3 results) No results for input(s): PROBNP in the last 8760 hours. HbA1C: No results for input(s): HGBA1C in the last 72 hours. CBG: No results for input(s): GLUCAP in the last 168 hours. Lipid Profile: Recent Labs    06/08/19 1725  TRIG 262*   Thyroid Function Tests: No results for input(s): TSH, T4TOTAL, FREET4, T3FREE, THYROIDAB in the last 72 hours. Anemia Panel: Recent Labs    06/09/19 0959 06/10/19 0404  FERRITIN 15 14   Urine analysis:    Component Value Date/Time   COLORURINE STRAW (A) 06/08/2019 1900   APPEARANCEUR CLOUDY (A) 06/08/2019 1900   LABSPEC 1.006 06/08/2019 1900   PHURINE 7.0 06/08/2019 1900   GLUCOSEU NEGATIVE 06/08/2019 1900   HGBUR NEGATIVE 06/08/2019 1900   BILIRUBINUR NEGATIVE 06/08/2019 1900   KETONESUR 20 (A) 06/08/2019 1900   PROTEINUR NEGATIVE 06/08/2019 1900   NITRITE NEGATIVE 06/08/2019 1900   LEUKOCYTESUR NEGATIVE 06/08/2019 1900   Sepsis Labs: @LABRCNTIP (procalcitonin:4,lacticidven:4)  ) Recent Results (from the past 240  hour(s))  Blood Culture (routine x 2)     Status: None (Preliminary result)   Collection Time: 06/08/19  5:25 PM   Specimen: BLOOD  Result Value Ref Range Status   Specimen Description BLOOD RIGHT ANTECUBITAL  Final   Special Requests   Final    BOTTLES DRAWN AEROBIC AND ANAEROBIC Blood Culture adequate volume   Culture   Final    NO GROWTH 2 DAYS Performed at [facility], [address] [place], [address] [place]    Report Status PENDING  Incomplete  SARS Coronavirus 2 by RT PCR (hospital order, performed in [facility] hospital lab) Nasopharyngeal Nasopharyngeal Swab     Status: Abnormal   Collection Time: 06/08/19  5:38 PM   Specimen: Nasopharyngeal Swab  Result Value Ref Range Status   SARS Coronavirus 2 POSITIVE (A) NEGATIVE Final    Comment: RESULT CALLED TO, READ BACK BY AND VERIFIED WITH: C GROSE RN 06/08/19 1840 JDW (NOTE) If result  is NEGATIVE SARS-CoV-2 target nucleic acids are NOT DETECTED. The SARS-CoV-2 RNA is generally detectable in upper and lower  respiratory specimens during the acute phase of infection. The lowest  concentration of SARS-CoV-2 viral copies this assay can detect is 250  copies / mL. A negative result does not preclude SARS-CoV-2 infection  and should not be used as the sole basis for treatment or other  patient management decisions.  A negative result may occur with  improper specimen collection / handling, submission of specimen other  than nasopharyngeal swab, presence of viral mutation(s) within the  areas targeted by this assay, and inadequate number of viral copies  (<250 copies / mL). A negative result must be combined with clinical  observations, patient history, and epidemiological information. If result is POSITIVE SARS-CoV-2 target nucleic acids are DETECTED. The SARS -CoV-2 RNA is generally detectable in upper and lower  respiratory specimens during the acute phase of infection.  Positive  results are indicative of active  infection with SARS-CoV-2.  Clinical  correlation with patient history and other diagnostic information is  necessary to determine patient infection status.  Positive results do  not rule out bacterial infection or co-infection with other viruses. If result is PRESUMPTIVE POSTIVE SARS-CoV-2 nucleic acids MAY BE PRESENT.   A presumptive positive result was obtained on the submitted specimen  and confirmed on repeat testing.  While 2019 novel coronavirus  (SARS-CoV-2) nucleic acids may be present in the submitted sample  additional confirmatory testing may be necessary for epidemiological  and / or clinical management purposes  to differentiate between  SARS-CoV-2 and other Sarbecovirus currently known to infect humans.  If clinically indicated additional testing with an alternate test  methodology [phone]([id number]) is advise d. The SARS-CoV-2 RNA is generally  detectable in upper and lower respiratory specimens during the acute  phase of infection. The expected result is Negative. Fact Sheet for Patients:  BoilerBrush.com.cyhttps://www.fda.gov/media/136312/download Fact Sheet for Healthcare Providers: https://pope.com/https://www.fda.gov/media/136313/download This test is not yet approved or cleared by the [place] FDA and has been authorized for detection and/or diagnosis of SARS-CoV-2 by FDA under an Emergency Use Authorization (EUA).  This EUA will remain in effect (meaning this test can be used) for the duration of the COVID-19 declaration under Section 564(b)(1) of the Act, 21 U.S.C. section 360bbb-3(b)(1), unless the authorization is terminated or revoked sooner. Performed at [facility], [address] [place], [place] [address]   Blood Culture (routine x 2)     Status: None (Preliminary result)   Collection Time: 06/09/19  9:59 AM   Specimen: BLOOD  Result Value Ref Range Status   Specimen Description BLOOD SITE NOT SPECIFIED  Final   Special Requests   Final    BOTTLES DRAWN AEROBIC ONLY Blood Culture  results may not be optimal due to an inadequate volume of blood received in culture bottles   Culture   Final    NO GROWTH 1 DAY Performed at [facility], [address] [place], [place] [address]    Report Status PENDING  Incomplete      Studies: No results found.  Scheduled Meds: . dexamethasone (DECADRON) injection  6 mg Intravenous Q24H  . enoxaparin (LOVENOX) injection  50 mg Subcutaneous Q24H  . folic acid  1 mg Oral Daily  . sodium chloride flush  3 mL Intravenous Q12H  . vitamin C  500 mg Oral Daily  . zinc sulfate  220 mg Oral Daily    Continuous Infusions: .  sodium chloride 100 mL/hr at 06/09/19 2122  . remdesivir 100 mg in NS 250 mL 100 mg (06/09/19 2243)     LOS: 1 day     Alma Friendly, MD [facility]  If 7PM-7AM, please contact night-coverage [email or web address] 06/10/2019, 4:11 PM

## 2019-06-10 NOTE — Progress Notes (Signed)
Spoke to Dr Freda Munro concerning Rhogam. He will call L & D to put order in or when he comes in in 2 hours. Rhogam is ready to be picked up at blood bank once order received in [facility].

## 2019-06-10 NOTE — Progress Notes (Signed)
NST completed. Pt denies LOF or vaginal bleeding. She reports some cramping through the night. States cramping increases when bladder is full. Pt encouraged to void more frequently. Reports good fetal movement. FHR Category I, no UC's or UI tracing.

## 2019-06-10 NOTE — Progress Notes (Addendum)
Patient did not receive Rhogam in ED per blood bank. No orders in [facility] for administration.

## 2019-06-10 NOTE — Progress Notes (Signed)
NST completed.  FHR Category I, no UC's noted.  Pt denies LOF, vaginal bleeding, cramping.  Reports good fetal movement.

## 2019-06-11 LAB — CBC WITH DIFFERENTIAL/PLATELET
Abs Immature Granulocytes: 0.04 10*3/uL (ref 0.00–0.07)
Basophils Absolute: 0 10*3/uL (ref 0.0–0.1)
Basophils Relative: 0 %
Eosinophils Absolute: 0 10*3/uL (ref 0.0–0.5)
Eosinophils Relative: 0 %
HCT: 30.2 % — ABNORMAL LOW (ref 36.0–46.0)
Hemoglobin: 9.9 g/dL — ABNORMAL LOW (ref 12.0–15.0)
Immature Granulocytes: 1 %
Lymphocytes Relative: 31 %
Lymphs Abs: 1.1 10*3/uL (ref 0.7–4.0)
MCH: 30 pg (ref 26.0–34.0)
MCHC: 32.8 g/dL (ref 30.0–36.0)
MCV: 91.5 fL (ref 80.0–100.0)
Monocytes Absolute: 0.2 10*3/uL (ref 0.1–1.0)
Monocytes Relative: 5 %
Neutro Abs: 2.2 10*3/uL (ref 1.7–7.7)
Neutrophils Relative %: 63 %
Platelets: 185 10*3/uL (ref 150–400)
RBC: 3.3 MIL/uL — ABNORMAL LOW (ref 3.87–5.11)
RDW: 13.2 % (ref 11.5–15.5)
WBC: 3.6 10*3/uL — ABNORMAL LOW (ref 4.0–10.5)
nRBC: 0 % (ref 0.0–0.2)

## 2019-06-11 LAB — RH IG WORKUP (INCLUDES ABO/RH)
ABO/RH(D): O NEG
Antibody Screen: NEGATIVE
Gestational Age(Wks): 28
Unit division: 0

## 2019-06-11 LAB — COMPREHENSIVE METABOLIC PANEL
ALT: 21 U/L (ref 0–44)
AST: 21 U/L (ref 15–41)
Albumin: 2.1 g/dL — ABNORMAL LOW (ref 3.5–5.0)
Alkaline Phosphatase: 68 U/L (ref 38–126)
Anion gap: 9 (ref 5–15)
BUN: <5 mg/dL — ABNORMAL LOW (ref 6–20)
CO2: 21 mmol/L — ABNORMAL LOW (ref 22–32)
Calcium: 8.1 mg/dL — ABNORMAL LOW (ref 8.9–10.3)
Chloride: 110 mmol/L (ref 98–111)
Creatinine, Ser: 0.56 mg/dL (ref 0.44–1.00)
GFR calc Af Amer: >60 mL/min (ref >60)
GFR calc non Af Amer: >60 mL/min (ref >60)
Glucose, Bld: 140 mg/dL — ABNORMAL HIGH (ref 70–99)
Potassium: 3.7 mmol/L (ref 3.5–5.1)
Sodium: 140 mmol/L (ref 135–145)
Total Bilirubin: 0.4 mg/dL (ref 0.3–1.2)
Total Protein: 4.9 g/dL — ABNORMAL LOW (ref 6.5–8.1)

## 2019-06-11 LAB — MAGNESIUM: Magnesium: 1.6 mg/dL — ABNORMAL LOW (ref 1.7–2.4)

## 2019-06-11 LAB — C-REACTIVE PROTEIN: CRP: 0.9 mg/dL (ref <1.0)

## 2019-06-11 LAB — FERRITIN: Ferritin: 11 ng/mL (ref 11–307)

## 2019-06-11 LAB — D-DIMER, QUANTITATIVE: D-Dimer, Quant: 2.06 ug/mL-FEU — ABNORMAL HIGH (ref 0.00–0.50)

## 2019-06-11 MED ORDER — MAGNESIUM SULFATE 2 GM/50ML IV SOLN
2.0000 g | Freq: Once | INTRAVENOUS | Status: AC
  Administered 2019-06-11: 2 g via INTRAVENOUS
  Filled 2019-06-11: qty 50

## 2019-06-11 NOTE — Progress Notes (Signed)
PROGRESS NOTE  Marissa Andrade ZOX:[id number] DOB: [birth year] DOA: 06/08/2019 PCP: Patient, No Pcp Per  HPI/Recap of past 24 hours: Marissa Andrade is a 23 y.o. female with no significant medical history, currently [redacted] wks gestation, presents to the ED complaining of shortness of breath, cough, tachycardia, that has been ongoing for about 10 days, decided to come to the ER due to worsening palpitations, and decreased fetal movements.  Patient reports she has been isolating herself, and her 24-year-old son.  Patient tested positive at her PCPs office, remained asymptomatic for a couple of days, but symptoms worsened.  In the ED, patient was significantly short of breath, tachypneic, with increased work of breathing, patient was saturating above 90 on room air, was noted to be tachycardic with heart rate in the 120s, afebrile, reported some possible contractions of which OB was consulted. Covid 19 test was positive, labs showed slightly elevated inflammatory markers.  Patient admitted for further management.    Today, patient reports feeling better, denies any worsening shortness of breath or cough, denies any fever/chills, denies any abdominal pain, nausea/vomiting.  Assessment/Plan: Active Problems:   Pneumonia due to COVID-19 virus   Pregnancy and infectious disease in third trimester  Pneumonia due to COVID-19 virus Currently afebrile, with leukopenia Currently weaning off of oxygen Inflammatory markers downtrending Procalcitonin negative Chest x-ray showed no active disease CTA chest showed multifocal areas of groundglass opacity throughout the lungs compatible with COVID-19, no large central or lobar pulmonary emboli, although suboptimal opacification of the pulmonary arteries due to significant respiratory motion and underlying parenchymal disease Continue Decadron, remdesivir Trend inflammatory markers Supplemental oxygen as needed  Hypomagnesemia Replace as needed  Anemia likely  related to pregnancy Management as per OB/GYN  [redacted] weeks gestation Spoke to Dr. Henderson CloudHorvath on 06/09/2019, who stated patient will be well managed on the medicine floor (2W) instead of an OB floor given primary condition was pneumonia due to COVID-19.  She reassured me that patient will be seen on a daily basis, as well as 3 times daily by the RN for fetal monitoring.  Also ED rapid response OB RN will be available for any OB concerns/emergencies.  RN on 2W are aware and would call for any OB related concerns Further management by OB/GYN        Malnutrition Type:      Malnutrition Characteristics:      Nutrition Interventions:       Estimated body mass index is 39.48 kg/m as calculated from the following:   Height as of this encounter: 5\' 4"  (1.626 m).   Weight as of this encounter: 104.3 kg.     Code Status: Full  Family Communication: None at bedside  Disposition Plan: To be determined, likely home   Consultants:  OB/GYN  Procedures:  None  Antimicrobials:  None  DVT prophylaxis: Lovenox   Objective: Vitals:   06/10/19 2025 06/11/19 0013 06/11/19 0431 06/11/19 0908  BP: 129/72 122/69 125/72   Pulse: 93 88 79   Resp: 20 (!) 21 19   Temp: 98.2 F (36.8 C) 97.9 F (36.6 C) 98 F (36.7 C) 97.8 F (36.6 C)  TempSrc: Oral Oral Oral Oral  SpO2:  99% 100%   Weight:      Height:        Intake/Output Summary (Last 24 hours) at 06/11/2019 1738 Last data filed at 06/11/2019 0300 Gross per 24 hour  Intake 2395.66 ml  Output -  Net 2395.66 ml   [employer]iled Weights  06/09/19 1451  Weight: 104.3 kg    Exam:  General: NAD   Cardiovascular: S1, S2 present  Respiratory: CTAB  Abdomen: Soft, nontender, gravid, bowel sounds present  Musculoskeletal: No bilateral pedal edema noted  Skin: Normal  Psychiatry: Normal mood    Data Reviewed: CBC: Recent Labs  Lab 06/08/19 1725 06/09/19 0451 06/10/19 0404 06/11/19 0430  WBC 4.2 3.8* 3.5* 3.6*   NEUTROABS 2.5 2.5 2.3 2.2  HGB 12.1 11.0* 9.5* 9.9*  HCT 36.8 34.0* 28.9* 30.2*  MCV 91.8 94.2 92.9 91.5  PLT 160 167 173 185   Basic Metabolic Panel: Recent Labs  Lab 06/08/19 1725 06/09/19 0451 06/10/19 0404 06/11/19 0430  NA 140 139 141 140  K 3.6 4.1 3.8 3.7  CL 109 112* 113* 110  CO2 19* 17* 19* 21*  GLUCOSE 80 158* 142* 140*  BUN <5* <5* <5* <5*  CREATININE 0.66 0.56 0.51 0.56  CALCIUM 8.7* 8.2* 8.1* 8.1*  MG  --  1.5* 1.7 1.6*   GFR: Estimated Creatinine Clearance: 127.5 mL/min (by C-G formula based on SCr of 0.56 mg/dL). Liver Function Tests: Recent Labs  Lab 06/08/19 1725 06/09/19 0451 06/10/19 0404 06/11/19 0430  AST 14* 15 18 21   ALT 12 13 15 21   ALKPHOS 78 86 67 68  BILITOT 0.5 0.3 0.6 0.4  PROT 5.9* 5.7* 5.0* 4.9*  ALBUMIN 2.5* 2.4* 2.1* 2.1*   No results for input(s): LIPASE, AMYLASE in the last 168 hours. No results for input(s): AMMONIA in the last 168 hours. Coagulation Profile: No results for input(s): INR, PROTIME in the last 168 hours. Cardiac Enzymes: No results for input(s): CKTOTAL, CKMB, CKMBINDEX, TROPONINI in the last 168 hours. BNP (last 3 results) No results for input(s): PROBNP in the last 8760 hours. HbA1C: No results for input(s): HGBA1C in the last 72 hours. CBG: No results for input(s): GLUCAP in the last 168 hours. Lipid Profile: No results for input(s): CHOL, HDL, LDLCALC, TRIG, CHOLHDL, LDLDIRECT in the last 72 hours. Thyroid Function Tests: No results for input(s): TSH, T4TOTAL, FREET4, T3FREE, THYROIDAB in the last 72 hours. Anemia Panel: Recent Labs    06/10/19 0404 06/11/19 0430  FERRITIN 14 11   Urine analysis:    Component Value Date/Time   COLORURINE STRAW (A) 06/08/2019 1900   APPEARANCEUR CLOUDY (A) 06/08/2019 1900   LABSPEC 1.006 06/08/2019 1900   PHURINE 7.0 06/08/2019 1900   GLUCOSEU NEGATIVE 06/08/2019 1900   HGBUR NEGATIVE 06/08/2019 1900   BILIRUBINUR NEGATIVE 06/08/2019 1900   KETONESUR 20  (A) 06/08/2019 1900   PROTEINUR NEGATIVE 06/08/2019 1900   NITRITE NEGATIVE 06/08/2019 1900   LEUKOCYTESUR NEGATIVE 06/08/2019 1900   Sepsis Labs: @LABRCNTIP (procalcitonin:4,lacticidven:4)  ) Recent Results (from the past 240 hour(s))  Blood Culture (routine x 2)     Status: None (Preliminary result)   Collection Time: 06/08/19  5:25 PM   Specimen: BLOOD  Result Value Ref Range Status   Specimen Description BLOOD RIGHT ANTECUBITAL  Final   Special Requests   Final    BOTTLES DRAWN AEROBIC AND ANAEROBIC Blood Culture adequate volume   Culture   Final    NO GROWTH 3 DAYS Performed at [facility], [address] [place], [facility] [address]    Report Status PENDING  Incomplete  SARS Coronavirus 2 by RT PCR (hospital order, performed in [facility] hospital lab) Nasopharyngeal Nasopharyngeal Swab     Status: Abnormal   Collection Time: 06/08/19  5:38 PM   Specimen: Nasopharyngeal  Swab  Result Value Ref Range Status   SARS Coronavirus 2 POSITIVE (A) NEGATIVE Final    Comment: RESULT CALLED TO, READ BACK BY AND VERIFIED WITH: C GROSE RN 06/08/19 1840 JDW (NOTE) If result is NEGATIVE SARS-CoV-2 target nucleic acids are NOT DETECTED. The SARS-CoV-2 RNA is generally detectable in upper and lower  respiratory specimens during the acute phase of infection. The lowest  concentration of SARS-CoV-2 viral copies this assay can detect is 250  copies / mL. A negative result does not preclude SARS-CoV-2 infection  and should not be used as the sole basis for treatment or other  patient management decisions.  A negative result may occur with  improper specimen collection / handling, submission of specimen other  than nasopharyngeal swab, presence of viral mutation(s) within the  areas targeted by this assay, and inadequate number of viral copies  (<250 copies / mL). A negative result must be combined with clinical  observations, patient history, and epidemiological information. If  result is POSITIVE SARS-CoV-2 target nucleic acids are DETECTED. The SARS -CoV-2 RNA is generally detectable in upper and lower  respiratory specimens during the acute phase of infection.  Positive  results are indicative of active infection with SARS-CoV-2.  Clinical  correlation with patient history and other diagnostic information is  necessary to determine patient infection status.  Positive results do  not rule out bacterial infection or co-infection with other viruses. If result is PRESUMPTIVE POSTIVE SARS-CoV-2 nucleic acids MAY BE PRESENT.   A presumptive positive result was obtained on the submitted specimen  and confirmed on repeat testing.  While 2019 novel coronavirus  (SARS-CoV-2) nucleic acids may be present in the submitted sample  additional confirmatory testing may be necessary for epidemiological  and / or clinical management purposes  to differentiate between  SARS-CoV-2 and other Sarbecovirus currently known to infect humans.  If clinically indicated additional testing with an alternate test  methodology [phone]) is advise d. The SARS-CoV-2 RNA is generally  detectable in upper and lower respiratory specimens during the acute  phase of infection. The expected result is Negative. Fact Sheet for Patients:  StrictlyIdeas.no Fact Sheet for Healthcare Providers: BankingDealers.co.za This test is not yet approved or cleared by the [place] FDA and has been authorized for detection and/or diagnosis of SARS-CoV-2 by FDA under an Emergency Use Authorization (EUA).  This EUA will remain in effect (meaning this test can be used) for the duration of the COVID-19 declaration under Section 564(b)(1) of the Act, 21 U.S.C. section 360bbb-3(b)(1), unless the authorization is terminated or revoked sooner. Performed at [facility], [place] [address], [place], [place] [address]   Blood Culture (routine x 2)     Status: None  (Preliminary result)   Collection Time: 06/09/19  9:59 AM   Specimen: BLOOD  Result Value Ref Range Status   Specimen Description BLOOD SITE NOT SPECIFIED  Final   Special Requests   Final    BOTTLES DRAWN AEROBIC ONLY Blood Culture results may not be optimal due to an inadequate volume of blood received in culture bottles   Culture   Final    NO GROWTH 2 DAYS Performed at [facility], [place] [address], [place], [place] [address]    Report Status PENDING  Incomplete      Studies: No results found.  Scheduled Meds: . dexamethasone (DECADRON) injection  6 mg Intravenous Q24H  . enoxaparin (LOVENOX) injection  50 mg Subcutaneous Q24H  . folic acid  1 mg Oral Daily  . sodium chloride flush  3 mL Intravenous Q12H  . vitamin C  500 mg Oral Daily  . zinc sulfate  220 mg Oral Daily    Continuous Infusions: . sodium chloride 100 mL/hr at 06/11/19 0626  . remdesivir 100 mg in NS 250 mL 100 mg (06/10/19 2157)     LOS: 2 days     Briant Cedar, MD [facility]  If 7PM-7AM, please contact night-coverage [email or web address] 06/11/2019, 5:38 PM

## 2019-06-11 NOTE — Progress Notes (Signed)
RROB at bedside to perform NST. Patient denies VB, LOF, or contractions since last NST. Reports positive FM.EFM reactive with Cat 1 tracing.

## 2019-06-11 NOTE — Progress Notes (Signed)
SATURATION QUALIFICATIONS: (This note is used to comply with regulatory documentation for home oxygen)  Patient Saturations on Room Air at Rest = 98%  Patient Saturations on Room Air while Ambulating = 97%  Patient Saturations on  Liters of oxygen while Ambulating = %  Please briefly explain why patient needs home oxygen: 

## 2019-06-11 NOTE — Progress Notes (Signed)
NST completed.FHR baseline 145, moderate variability, accels, no decels. No uc's.  No c/o vaginal bleeding or leaking of fluid.

## 2019-06-12 LAB — MAGNESIUM: Magnesium: 1.6 mg/dL — ABNORMAL LOW (ref 1.7–2.4)

## 2019-06-12 LAB — COMPREHENSIVE METABOLIC PANEL
ALT: 26 U/L (ref 0–44)
AST: 25 U/L (ref 15–41)
Albumin: 2.2 g/dL — ABNORMAL LOW (ref 3.5–5.0)
Alkaline Phosphatase: 70 U/L (ref 38–126)
Anion gap: 6 (ref 5–15)
BUN: 5 mg/dL — ABNORMAL LOW (ref 6–20)
CO2: 21 mmol/L — ABNORMAL LOW (ref 22–32)
Calcium: 8.1 mg/dL — ABNORMAL LOW (ref 8.9–10.3)
Chloride: 111 mmol/L (ref 98–111)
Creatinine, Ser: 0.47 mg/dL (ref 0.44–1.00)
GFR calc Af Amer: >60 mL/min (ref >60)
GFR calc non Af Amer: >60 mL/min (ref >60)
Glucose, Bld: 150 mg/dL — ABNORMAL HIGH (ref 70–99)
Potassium: 3.6 mmol/L (ref 3.5–5.1)
Sodium: 138 mmol/L (ref 135–145)
Total Bilirubin: 0.5 mg/dL (ref 0.3–1.2)
Total Protein: 5 g/dL — ABNORMAL LOW (ref 6.5–8.1)

## 2019-06-12 LAB — CBC WITH DIFFERENTIAL/PLATELET
Abs Immature Granulocytes: 0.08 10*3/uL — ABNORMAL HIGH (ref 0.00–0.07)
Basophils Absolute: 0 10*3/uL (ref 0.0–0.1)
Basophils Relative: 0 %
Eosinophils Absolute: 0 10*3/uL (ref 0.0–0.5)
Eosinophils Relative: 0 %
HCT: 30.2 % — ABNORMAL LOW (ref 36.0–46.0)
Hemoglobin: 10.2 g/dL — ABNORMAL LOW (ref 12.0–15.0)
Immature Granulocytes: 2 %
Lymphocytes Relative: 23 %
Lymphs Abs: 0.9 10*3/uL (ref 0.7–4.0)
MCH: 30.8 pg (ref 26.0–34.0)
MCHC: 33.8 g/dL (ref 30.0–36.0)
MCV: 91.2 fL (ref 80.0–100.0)
Monocytes Absolute: 0.2 10*3/uL (ref 0.1–1.0)
Monocytes Relative: 6 %
Neutro Abs: 2.5 10*3/uL (ref 1.7–7.7)
Neutrophils Relative %: 69 %
Platelets: 213 10*3/uL (ref 150–400)
RBC: 3.31 MIL/uL — ABNORMAL LOW (ref 3.87–5.11)
RDW: 13.1 % (ref 11.5–15.5)
WBC: 3.7 10*3/uL — ABNORMAL LOW (ref 4.0–10.5)
nRBC: 0 % (ref 0.0–0.2)

## 2019-06-12 LAB — D-DIMER, QUANTITATIVE: D-Dimer, Quant: 1.92 ug/mL-FEU — ABNORMAL HIGH (ref 0.00–0.50)

## 2019-06-12 LAB — FERRITIN: Ferritin: 9 ng/mL — ABNORMAL LOW (ref 11–307)

## 2019-06-12 LAB — C-REACTIVE PROTEIN: CRP: <0.8 mg/dL (ref <1.0)

## 2019-06-12 MED ORDER — MAGNESIUM SULFATE 2 GM/50ML IV SOLN
2.0000 g | Freq: Once | INTRAVENOUS | Status: AC
  Administered 2019-06-12: 2 g via INTRAVENOUS
  Filled 2019-06-12: qty 50

## 2019-06-12 NOTE — Progress Notes (Addendum)
NST completed. Category 1 tracing.No vaginal bleeding or leaking of fluid.Pt asked  when she will get her glucola test. I will follow up with Dr. Ouida Sills.

## 2019-06-12 NOTE — Progress Notes (Signed)
Spoke with Dr. Ouida Sills. Orders received to d/c pt's glucose tolerance test since she has had steroids.

## 2019-06-12 NOTE — Progress Notes (Signed)
Spoke with Dr. Ouida Sills. Pt 's NST is reactive with no uc's. No vaginal bleeding or leaking of fluid.Orders received for 50gm glucose tolerance test.

## 2019-06-12 NOTE — Progress Notes (Signed)
PROGRESS NOTE  [name] WGN:[id number] DOB: Sep 24, 1995 DOA: 06/08/2019 PCP: Patient, No Pcp Per  HPI/Recap of past 24 hours: Marissa Andrade is a 23 y.o. female with no significant medical history, currently [redacted] wks gestation, presents to the ED complaining of shortness of breath, cough, tachycardia, that has been ongoing for about 10 days, decided to come to the ER due to worsening palpitations, and decreased fetal movements.  Patient reports she has been isolating herself, and her 86-year-old son.  Patient tested positive at her PCPs office, remained asymptomatic for a couple of days, but symptoms worsened.  In the ED, patient was significantly short of breath, tachypneic, with increased work of breathing, patient was saturating above 90 on room air, was noted to be tachycardic with heart rate in the 120s, afebrile, reported some possible contractions of which OB was consulted. Covid 19 test was positive, labs showed slightly elevated inflammatory markers.  Patient admitted for further management.     Today, patient reports feeling much better, denies any new complaints.  Reports breathing has improved, as well as cough.  No fever/chills noted.   Assessment/Plan: Active Problems:   Pneumonia due to COVID-19 virus   Pregnancy and infectious disease in third trimester  Pneumonia due to COVID-19 virus Currently afebrile, with leukopenia Currently continue sats on room air Inflammatory markers downtrending Procalcitonin negative Chest x-ray showed no active disease CTA chest showed multifocal areas of groundglass opacity throughout the lungs compatible with COVID-19, no large central or lobar pulmonary emboli, although suboptimal opacification of the pulmonary arteries due to significant respiratory motion and underlying parenchymal disease Continue Decadron, remdesivir, last dose on 06/12/2019 Trend inflammatory markers Supplemental oxygen as needed  Hypomagnesemia Replace as needed   Anemia likely related to pregnancy Management as per OB/GYN  [redacted] weeks gestation Spoke to Dr. Henderson Cloud on 06/09/2019, who stated patient will be well managed on the medicine floor (2W) instead of an OB floor given primary condition was pneumonia due to COVID-19.  She reassured me that patient will be seen on a daily basis, as well as 3 times daily by the RN for fetal monitoring.  Also ED rapid response OB RN will be available for any OB concerns/emergencies.  RN on 2W are aware and would call for any OB related concerns Further management by OB/GYN        Malnutrition Type:      Malnutrition Characteristics:      Nutrition Interventions:       Estimated body mass index is 39.48 kg/m as calculated from the following:   Height as of this encounter:  (1.626 m).   Weight as of this encounter: 104.3 kg.     Code Status: Full  Family Communication: None at bedside  Disposition Plan: To be determined, likely home on 06/13/2019   Consultants:  OB/GYN  Procedures:  None  Antimicrobials:  None  DVT prophylaxis: Lovenox   Objective: Vitals:   06/11/19 2300 06/12/19 0425 06/12/19 0808 06/12/19 1133  BP: 134/89 115/69 132/77 120/81  Pulse: 99  (!) 102 (!) 106  Resp: 19  (!) 23 20  Temp: 98.7 F (37.1 C) 97.9 F (36.6 C) 97.8 F (36.6 C) (!) 97.1 F (36.2 C)  TempSrc:  Oral Oral Oral  SpO2: 97%  96% 96%  Weight:      Height:        Intake/Output Summary (Last 24 hours) at 06/12/2019 1425 Last data filed at 06/12/2019 0800 Gross per 24 hour  Intake 1852.77  ml  Output 1 ml  Net 1851.77 ml   Filed Weights   06/09/19 1451  Weight: 104.3 kg    Exam:  General: NAD   Cardiovascular: S1, S2 present  Respiratory: CTAB  Abdomen: Soft, nontender, gravid, bowel sounds present  Musculoskeletal: No bilateral pedal edema noted  Skin: Normal  Psychiatry: Normal mood    Data Reviewed: CBC: Recent Labs  Lab 06/08/19 1725 06/09/19 0451  06/10/19 0404 06/11/19 0430 06/12/19 0543  WBC 4.2 3.8* 3.5* 3.6* 3.7*  NEUTROABS 2.5 2.5 2.3 2.2 2.5  HGB 12.1 11.0* 9.5* 9.9* 10.2*  HCT 36.8 34.0* 28.9* 30.2* 30.2*  MCV 91.8 94.2 92.9 91.5 91.2  PLT 160 167 173 185 213   Basic Metabolic Panel: Recent Labs  Lab 06/08/19 1725 06/09/19 0451 06/10/19 0404 06/11/19 0430 06/12/19 0543  NA 140 139 141 140 138  K 3.6 4.1 3.8 3.7 3.6  CL 109 112* 113* 110 111  CO2 19* 17* 19* 21* 21*  GLUCOSE 80 158* 142* 140* 150*  BUN <5* <5* <5* <5* 5*  CREATININE 0.66 0.56 0.51 0.56 0.47  CALCIUM 8.7* 8.2* 8.1* 8.1* 8.1*  MG  --  1.5* 1.7 1.6* 1.6*   GFR: Estimated Creatinine Clearance: 127.5 mL/min (by C-G formula based on SCr of 0.47 mg/dL). Liver Function Tests: Recent Labs  Lab 06/08/19 1725 06/09/19 0451 06/10/19 0404 06/11/19 0430 06/12/19 0543  AST 14* 15 18 21 25   ALT 12 13 15 21 26   ALKPHOS 78 86 67 68 70  BILITOT 0.5 0.3 0.6 0.4 0.5  PROT 5.9* 5.7* 5.0* 4.9* 5.0*  ALBUMIN 2.5* 2.4* 2.1* 2.1* 2.2*   No results for input(s): LIPASE, AMYLASE in the last 168 hours. No results for input(s): AMMONIA in the last 168 hours. Coagulation Profile: No results for input(s): INR, PROTIME in the last 168 hours. Cardiac Enzymes: No results for input(s): CKTOTAL, CKMB, CKMBINDEX, TROPONINI in the last 168 hours. BNP (last 3 results) No results for input(s): PROBNP in the last 8760 hours. HbA1C: No results for input(s): HGBA1C in the last 72 hours. CBG: No results for input(s): GLUCAP in the last 168 hours. Lipid Profile: No results for input(s): CHOL, HDL, LDLCALC, TRIG, CHOLHDL, LDLDIRECT in the last 72 hours. Thyroid Function Tests: No results for input(s): TSH, T4TOTAL, FREET4, T3FREE, THYROIDAB in the last 72 hours. Anemia Panel: Recent Labs    06/11/19 0430 06/12/19 0543  FERRITIN 11 9*   Urine analysis:    Component Value Date/Time   COLORURINE STRAW (A) 06/08/2019 1900   APPEARANCEUR CLOUDY (A) 06/08/2019 1900    LABSPEC 1.006 06/08/2019 1900   PHURINE 7.0 06/08/2019 1900   GLUCOSEU NEGATIVE 06/08/2019 1900   HGBUR NEGATIVE 06/08/2019 1900   BILIRUBINUR NEGATIVE 06/08/2019 1900   KETONESUR 20 (A) 06/08/2019 1900   PROTEINUR NEGATIVE 06/08/2019 1900   NITRITE NEGATIVE 06/08/2019 1900   LEUKOCYTESUR NEGATIVE 06/08/2019 1900   Sepsis Labs: @LABRCNTIP (procalcitonin:4,lacticidven:4)  ) Recent Results (from the past 240 hour(s))  Blood Culture (routine x 2)     Status: None (Preliminary result)   Collection Time: 06/08/19  5:25 PM   Specimen: BLOOD  Result Value Ref Range Status   Specimen Description BLOOD RIGHT ANTECUBITAL  Final   Special Requests   Final    BOTTLES DRAWN AEROBIC AND ANAEROBIC Blood Culture adequate volume   Culture   Final    NO GROWTH 4 DAYS Performed at [facility], [address] [place], [facility] [address]  Report Status PENDING  Incomplete  SARS Coronavirus 2 by RT PCR (hospital order, performed in [facility] hospital lab) Nasopharyngeal Nasopharyngeal Swab     Status: Abnormal   Collection Time: 06/08/19  5:38 PM   Specimen: Nasopharyngeal Swab  Result Value Ref Range Status   SARS Coronavirus 2 POSITIVE (A) NEGATIVE Final    Comment: RESULT CALLED TO, READ BACK BY AND VERIFIED WITH: C GROSE RN 06/08/19 1840 JDW (NOTE) If result is NEGATIVE SARS-CoV-2 target nucleic acids are NOT DETECTED. The SARS-CoV-2 RNA is generally detectable in upper and lower  respiratory specimens during the acute phase of infection. The lowest  concentration of SARS-CoV-2 viral copies this assay can detect is 250  copies / mL. A negative result does not preclude SARS-CoV-2 infection  and should not be used as the sole basis for treatment or other  patient management decisions.  A negative result may occur with  improper specimen collection / handling, submission of specimen other  than nasopharyngeal swab, presence of viral mutation(s) within the  areas targeted by this  assay, and inadequate number of viral copies  (<250 copies / mL). A negative result must be combined with clinical  observations, patient history, and epidemiological information. If result is POSITIVE SARS-CoV-2 target nucleic acids are DETECTED. The SARS -CoV-2 RNA is generally detectable in upper and lower  respiratory specimens during the acute phase of infection.  Positive  results are indicative of active infection with SARS-CoV-2.  Clinical  correlation with patient history and other diagnostic information is  necessary to determine patient infection status.  Positive results do  not rule out bacterial infection or co-infection with other viruses. If result is PRESUMPTIVE POSTIVE SARS-CoV-2 nucleic acids MAY BE PRESENT.   A presumptive positive result was obtained on the submitted specimen  and confirmed on repeat testing.  While 2019 novel coronavirus  (SARS-CoV-2) nucleic acids may be present in the submitted sample  additional confirmatory testing may be necessary for epidemiological  and / or clinical management purposes  to differentiate between  SARS-CoV-2 and other Sarbecovirus currently known to infect humans.  If clinically indicated additional testing with an alternate test  methodology [phone]) is advise d. The SARS-CoV-2 RNA is generally  detectable in upper and lower respiratory specimens during the acute  phase of infection. The expected result is Negative. Fact Sheet for Patients:  StrictlyIdeas.no Fact Sheet for Healthcare Providers: BankingDealers.co.za This test is not yet approved or cleared by the [place] FDA and has been authorized for detection and/or diagnosis of SARS-CoV-2 by FDA under an Emergency Use Authorization (EUA).  This EUA will remain in effect (meaning this test can be used) for the duration of the COVID-19 declaration under Section 564(b)(1) of the Act, 21 U.S.C. section 360bbb-3(b)(1),  unless the authorization is terminated or revoked sooner. Performed at [facility], [place] [address], [place], [place] [address]   Blood Culture (routine x 2)     Status: None (Preliminary result)   Collection Time: 06/09/19  9:59 AM   Specimen: BLOOD  Result Value Ref Range Status   Specimen Description BLOOD SITE NOT SPECIFIED  Final   Special Requests   Final    BOTTLES DRAWN AEROBIC ONLY Blood Culture results may not be optimal due to an inadequate volume of blood received in culture bottles   Culture   Final    NO GROWTH 3 DAYS Performed at [facility], [place] [address] [place], [place] [address]  Report Status PENDING  Incomplete      Studies: No results found.  Scheduled Meds: . dexamethasone (DECADRON) injection  6 mg Intravenous Q24H  . enoxaparin (LOVENOX) injection  50 mg Subcutaneous Q24H  . folic acid  1 mg Oral Daily  . sodium chloride flush  3 mL Intravenous Q12H  . vitamin C  500 mg Oral Daily  . zinc sulfate  220 mg Oral Daily    Continuous Infusions: . remdesivir 100 mg in NS 250 mL 100 mg (06/11/19 2331)     LOS: 3 days     Briant CedarNkeiruka J , MD [facility]  If 7PM-7AM, please contact night-coverage [email or web address] 06/12/2019, 2:25 PM

## 2019-06-12 NOTE — Progress Notes (Signed)
Daily Nursing Note  Received report from [place], [place]. Introduced self to patient who was in good spirits. Remains on RA. Magnesium sulfate 2g IV given. Patient has a good appetite with meals brought from family. Last dose of remdesivir is due this evening. Per discussion with Dr. Horris Latino patient will hopefully discharge tomorrow morning. All patient needs met during the course of the day.

## 2019-06-13 LAB — CBC WITH DIFFERENTIAL/PLATELET
Abs Immature Granulocytes: 0.14 10*3/uL — ABNORMAL HIGH (ref 0.00–0.07)
Basophils Absolute: 0 10*3/uL (ref 0.0–0.1)
Basophils Relative: 0 %
Eosinophils Absolute: 0 10*3/uL (ref 0.0–0.5)
Eosinophils Relative: 0 %
HCT: 29.7 % — ABNORMAL LOW (ref 36.0–46.0)
Hemoglobin: 10.1 g/dL — ABNORMAL LOW (ref 12.0–15.0)
Immature Granulocytes: 3 %
Lymphocytes Relative: 21 %
Lymphs Abs: 1.1 10*3/uL (ref 0.7–4.0)
MCH: 30.4 pg (ref 26.0–34.0)
MCHC: 34 g/dL (ref 30.0–36.0)
MCV: 89.5 fL (ref 80.0–100.0)
Monocytes Absolute: 0.3 10*3/uL (ref 0.1–1.0)
Monocytes Relative: 6 %
Neutro Abs: 3.5 10*3/uL (ref 1.7–7.7)
Neutrophils Relative %: 70 %
Platelets: 241 10*3/uL (ref 150–400)
RBC: 3.32 MIL/uL — ABNORMAL LOW (ref 3.87–5.11)
RDW: 12.9 % (ref 11.5–15.5)
WBC: 5 10*3/uL (ref 4.0–10.5)
nRBC: 0 % (ref 0.0–0.2)

## 2019-06-13 LAB — COMPREHENSIVE METABOLIC PANEL
ALT: 32 U/L (ref 0–44)
AST: 24 U/L (ref 15–41)
Albumin: 2.2 g/dL — ABNORMAL LOW (ref 3.5–5.0)
Alkaline Phosphatase: 70 U/L (ref 38–126)
Anion gap: 8 (ref 5–15)
BUN: 16 mg/dL (ref 6–20)
CO2: 20 mmol/L — ABNORMAL LOW (ref 22–32)
Calcium: 7.8 mg/dL — ABNORMAL LOW (ref 8.9–10.3)
Chloride: 110 mmol/L (ref 98–111)
Creatinine, Ser: 0.48 mg/dL (ref 0.44–1.00)
GFR calc Af Amer: >60 mL/min (ref >60)
GFR calc non Af Amer: >60 mL/min (ref >60)
Glucose, Bld: 160 mg/dL — ABNORMAL HIGH (ref 70–99)
Potassium: 3.7 mmol/L (ref 3.5–5.1)
Sodium: 138 mmol/L (ref 135–145)
Total Bilirubin: 0.2 mg/dL — ABNORMAL LOW (ref 0.3–1.2)
Total Protein: 5 g/dL — ABNORMAL LOW (ref 6.5–8.1)

## 2019-06-13 LAB — CULTURE, BLOOD (ROUTINE X 2)
Culture: NO GROWTH
Special Requests: ADEQUATE

## 2019-06-13 LAB — MAGNESIUM: Magnesium: 1.8 mg/dL (ref 1.7–2.4)

## 2019-06-13 LAB — FERRITIN: Ferritin: 8 ng/mL — ABNORMAL LOW (ref 11–307)

## 2019-06-13 LAB — C-REACTIVE PROTEIN: CRP: <0.8 mg/dL (ref <1.0)

## 2019-06-13 LAB — D-DIMER, QUANTITATIVE: D-Dimer, Quant: 2.01 ug/mL-FEU — ABNORMAL HIGH (ref 0.00–0.50)

## 2019-06-13 MED ORDER — DEXAMETHASONE 6 MG PO TABS: 6.0000 mg | ORAL_TABLET | Freq: Every day | ORAL | 0 refills | Status: AC

## 2019-06-13 MED ORDER — GUAIFENESIN-DM 100-10 MG/5ML PO SYRP: 10.0000 mL | ORAL_SOLUTION | ORAL | 0 refills | Status: DC | PRN

## 2019-06-13 MED ORDER — ZINC SULFATE 220 (50 ZN) MG PO CAPS: 220.0000 mg | ORAL_CAPSULE | Freq: Every day | ORAL | 0 refills | Status: AC

## 2019-06-13 MED ORDER — ASCORBIC ACID 500 MG PO TABS: 500.0000 mg | ORAL_TABLET | Freq: Every day | ORAL | 0 refills | Status: AC

## 2019-06-13 NOTE — Discharge Summary (Signed)
Discharge Summary  Marissa RiderSummer Andrade ZOX:[id number] DOB: August 08, 1996  PCP: Patient, No Pcp Per  Admit date: 06/08/2019 Discharge date: 06/13/2019  Time spent: 40 mins  Recommendations for Outpatient Follow-up:  1. Follow up with PCP/OBGYN   Discharge Diagnoses:  Active Hospital Problems   Diagnosis Date Noted   Pneumonia due to COVID-19 virus 06/08/2019   Pregnancy and infectious disease in third trimester 06/08/2019    Resolved Hospital Problems  No resolved problems to display.    Discharge Condition: Stable  Diet recommendation: As tolerated  Vitals:   06/13/19 0700 06/13/19 0900  BP:  133/84  Pulse: (!) 101 92  Resp: (!) 22 19  Temp:  98.7 F (37.1 C)  SpO2: 98% 97%    History of present illness:  Marissa Fowleris a 23 y.o.femalewith no significant medical history, currently [redacted] wks gestation, presents to the ED complaining of shortness of breath, cough, tachycardia, that has been ongoing for about 10 days, decided to come to the ER due to worsening palpitations, and decreased fetal movements.  Patient reports she has been isolating herself, and her 24-year-old son.  Patient tested positive at her PCPs office, remained asymptomatic for a couple of days, but symptoms worsened.  In the ED, patient was significantly short of breath, tachypneic, with increased work of breathing, patient was saturating above 90 on room air, was noted to be tachycardic with heart rate in the 120s, afebrile, reported some possible contractions of which OB was consulted. Covid 19 test was positive, labs showed slightly elevated inflammatory markers.  Patient admitted for further management.   Today, pt denies any new complaints. Reports feeling much better, denies any SOB, worsening cough, abdominal pain, fever/chills. Pt stable to be d/c, with close follow up with PCP/OBGYN    Hospital Course:  Active Problems:   Pneumonia due to COVID-19 virus   Pregnancy and infectious disease in third  trimester   Pneumonia due to COVID-19 virus Currently afebrile, with leukopenia Currently saturating around 97% on room air Inflammatory markers downtrending Procalcitonin negative Chest x-ray showed no active disease CTA chest showed multifocal areas of groundglass opacity throughout the lungs compatible with COVID-19, no large central or lobar pulmonary emboli, although suboptimal opacification of the pulmonary arteries due to significant respiratory motion and underlying parenchymal disease Continue Decadron, last dose on 06/17/19 Completed remdesivir, last dose on 06/12/2019 Follow up with PCP/OBGYN  Hypomagnesemia Replaced as needed  Anemia likely related to pregnancy Management as per OB/GYN  [redacted] weeks gestation on presentation Follow up with OBGYN        Malnutrition Type:      Malnutrition Characteristics:      Nutrition Interventions:      Estimated body mass index is 39.48 kg/m as calculated from the following:   Height as of this encounter: 5\' 4"  (1.626 m).   Weight as of this encounter: 104.3 kg.    Procedures:  None  Consultations:  OBGYN  Discharge Exam: BP 133/84 (BP Location: Left Arm)    Pulse 92    Temp 98.7 F (37.1 C) (Oral)    Resp 19    Ht 5\' 4"  (1.626 m)    Wt 104.3 kg    SpO2 97%    BMI 39.48 kg/m   General: NAD Cardiovascular: S1, S2 present Respiratory: CTAB  Discharge Instructions You were cared for by a hospitalist during your hospital stay. If you have any questions about your discharge medications or the care you received while you were in the hospital  after you are discharged, you can call the unit and asked to speak with the hospitalist on call if the hospitalist that took care of you is not available. Once you are discharged, your primary care physician will handle any further medical issues. Please note that NO REFILLS for any discharge medications will be authorized once you are discharged, as it is imperative that  you return to your primary care physician (or establish a relationship with a primary care physician if you do not have one) for your aftercare needs so that they can reassess your need for medications and monitor your lab values.  Discharge Instructions    Diet - low sodium heart healthy   Complete by: As directed    Increase activity slowly   Complete by: As directed      Allergies as of 06/13/2019   No Known Allergies     Medication List    TAKE these medications   ascorbic acid 500 MG tablet Commonly known as: VITAMIN C Take 1 tablet (500 mg total) by mouth daily for 14 days. Start taking on: June 14, 2019   aspirin EC 81 MG tablet Take 81 mg by mouth daily.   dexamethasone 6 MG tablet Commonly known as: Decadron Take 1 tablet (6 mg total) by mouth daily for 6 days.   guaiFENesin-dextromethorphan 100-10 MG/5ML syrup Commonly known as: ROBITUSSIN DM Take 10 mLs by mouth every 4 (four) hours as needed for cough.   prenatal vitamin w/FE, FA 27-1 MG Tabs tablet Take 1 tablet by mouth daily at 12 noon.   zinc sulfate 220 (50 Zn) MG capsule Take 1 capsule (220 mg total) by mouth daily for 14 days. Start taking on: June 14, 2019      No Known Allergies Follow-up Information    Ob/Gyn, Nestor Ramp Follow up in 7 day(s).   Why: routine OB visit  Contact information: [address] [place] [address] [phone]            The results of significant diagnostics from this hospitalization (including imaging, microbiology, ancillary and laboratory) are listed below for reference.    Significant Diagnostic Studies: Ct Angio Chest Pe W And/or Wo Contrast  Result Date: 06/08/2019 CLINICAL DATA:  Shortness of breath, palpitations, decreased fetal movement, PE suspected, COVID-19 positive EXAM: CT ANGIOGRAPHY CHEST WITH CONTRAST TECHNIQUE: Multidetector CT imaging of the chest was performed using the standard protocol during bolus administration  of intravenous contrast. Multiplanar CT image reconstructions and MIPs were obtained to evaluate the vascular anatomy. CONTRAST:  OMNIPAQUE IOHEXOL 350 MG/ML SOLN COMPARISON:  CTA chest 07/06/2017 FINDINGS: Cardiovascular: Suboptimal opacification of the pulmonary arteries with evaluation further complicated by respiratory motion in underlying parenchymal disease. No large central or lobar pulmonary arteries are clearly evident. Pulmonary trunk is upper limits normal of size. No elevation of the RV/LV ratio (0.8). Normal heart size. No pericardial effusion. Nonaneurysmal thoracic aorta with normal 3 vessel branching of the arch. No acute aortic abnormality is seen. Mediastinum/Nodes: No mediastinal, hilar or axillary adenopathy. Thyroid gland and thoracic inlet are unremarkable. No acute abnormality of the trachea or esophagus. Lungs/Pleura: Multifocal areas of ground-glass opacity throughout the lungs are compatible with known diagnosis of COVID 19. No pneumothorax or effusion. No suspicious nodules or masses. Upper Abdomen: Included portions of the upper abdomen are unremarkable. Musculoskeletal: No acute osseous abnormality or suspicious osseous lesion. No suspicious chest wall lesions. Review of the MIP images confirms the above findings. IMPRESSION: Suboptimal  opacification of the pulmonary arteries with evaluation further complicated by respiratory motion and underlying parenchymal disease. No large central or lobar pulmonary arteries are clearly evident. Multifocal areas of ground-glass opacity throughout the lungs are compatible with atypical infection and known diagnosis of COVID 19. Electronically Signed   By: Kreg Shropshire M.D.   On: 06/08/2019 21:34   Dg Chest Port 1 View  Result Date: 06/08/2019 CLINICAL DATA:  Shortness of breath, covid positive EXAM: PORTABLE CHEST 1 VIEW COMPARISON:  March 03, 2014 FINDINGS: The heart size and mediastinal contours are within normal limits. Shallow degree of  aeration. No large airspace consolidation or pleural effusion. No acute osseous abnormality. IMPRESSION: No active disease. Electronically Signed   By: Jonna Clark M.D.   On: 06/08/2019 17:54    Microbiology: Recent Results (from the past 240 hour(s))  Blood Culture (routine x 2)     Status: None   Collection Time: 06/08/19  5:25 PM   Specimen: BLOOD  Result Value Ref Range Status   Specimen Description BLOOD RIGHT ANTECUBITAL  Final   Special Requests   Final    BOTTLES DRAWN AEROBIC AND ANAEROBIC Blood Culture adequate volume   Culture   Final    NO GROWTH 5 DAYS Performed at [facility], [address] [place], [place] [address]    Report Status 06/13/2019 FINAL  Final  SARS Coronavirus 2 by RT PCR (hospital order, performed in [facility] hospital lab) Nasopharyngeal Nasopharyngeal Swab     Status: Abnormal   Collection Time: 06/08/19  5:38 PM   Specimen: Nasopharyngeal Swab  Result Value Ref Range Status   SARS Coronavirus 2 POSITIVE (A) NEGATIVE Final    Comment: RESULT CALLED TO, READ BACK BY AND VERIFIED WITH: C GROSE RN 06/08/19 1840 JDW (NOTE) If result is NEGATIVE SARS-CoV-2 target nucleic acids are NOT DETECTED. The SARS-CoV-2 RNA is generally detectable in upper and lower  respiratory specimens during the acute phase of infection. The lowest  concentration of SARS-CoV-2 viral copies this assay can detect is 250  copies / mL. A negative result does not preclude SARS-CoV-2 infection  and should not be used as the sole basis for treatment or other  patient management decisions.  A negative result may occur with  improper specimen collection / handling, submission of specimen other  than nasopharyngeal swab, presence of viral mutation(s) within the  areas targeted by this assay, and inadequate number of viral copies  (<250 copies / mL). A negative result must be combined with clinical  observations, patient history, and epidemiological information. If result is  POSITIVE SARS-CoV-2 target nucleic acids are DETECTED. The SARS -CoV-2 RNA is generally detectable in upper and lower  respiratory specimens during the acute phase of infection.  Positive  results are indicative of active infection with SARS-CoV-2.  Clinical  correlation with patient history and other diagnostic information is  necessary to determine patient infection status.  Positive results do  not rule out bacterial infection or co-infection with other viruses. If result is PRESUMPTIVE POSTIVE SARS-CoV-2 nucleic acids MAY BE PRESENT.   A presumptive positive result was obtained on the submitted specimen  and confirmed on repeat testing.  While 2019 novel coronavirus  (SARS-CoV-2) nucleic acids may be present in the submitted sample  additional confirmatory testing may be necessary for epidemiological  and / or clinical management purposes  to differentiate between  SARS-CoV-2 and other Sarbecovirus currently known to infect humans.  If clinically indicated additional testing with  an alternate test  methodology [phone]) is advise d. The SARS-CoV-2 RNA is generally  detectable in upper and lower respiratory specimens during the acute  phase of infection. The expected result is Negative. Fact Sheet for Patients:  StrictlyIdeas.no Fact Sheet for Healthcare Providers: BankingDealers.co.za This test is not yet approved or cleared by the [place] FDA and has been authorized for detection and/or diagnosis of SARS-CoV-2 by FDA under an Emergency Use Authorization (EUA).  This EUA will remain in effect (meaning this test can be used) for the duration of the COVID-19 declaration under Section 564(b)(1) of the Act, 21 U.S.C. section 360bbb-3(b)(1), unless the authorization is terminated or revoked sooner. Performed at [facility], [place] [address] [place], [place] [address]   Blood Culture (routine x 2)     Status: None  (Preliminary result)   Collection Time: 06/09/19  9:59 AM   Specimen: BLOOD  Result Value Ref Range Status   Specimen Description BLOOD SITE NOT SPECIFIED  Final   Special Requests   Final    BOTTLES DRAWN AEROBIC ONLY Blood Culture results may not be optimal due to an inadequate volume of blood received in culture bottles   Culture   Final    NO GROWTH 4 DAYS Performed at [facility], [place] [address], [place], [place] [address]    Report Status PENDING  Incomplete     Labs: Basic Metabolic Panel: Recent Labs  Lab 06/09/19 0451 06/10/19 0404 06/11/19 0430 06/12/19 0543 06/13/19 0430  NA 139 141 140 138 138  K 4.1 3.8 3.7 3.6 3.7  CL 112* 113* 110 111 110  CO2 17* 19* 21* 21* 20*  GLUCOSE 158* 142* 140* 150* 160*  BUN <5* <5* <5* 5* 16  CREATININE 0.56 0.51 0.56 0.47 0.48  CALCIUM 8.2* 8.1* 8.1* 8.1* 7.8*  MG 1.5* 1.7 1.6* 1.6* 1.8   Liver Function Tests: Recent Labs  Lab 06/09/19 0451 06/10/19 0404 06/11/19 0430 06/12/19 0543 06/13/19 0430  AST 15 18 21 25 24   ALT 13 15 21 26  32  ALKPHOS 86 67 68 70 70  BILITOT 0.3 0.6 0.4 0.5 0.2*  PROT 5.7* 5.0* 4.9* 5.0* 5.0*  ALBUMIN 2.4* 2.1* 2.1* 2.2* 2.2*   No results for input(s): LIPASE, AMYLASE in the last 168 hours. No results for input(s): AMMONIA in the last 168 hours. CBC: Recent Labs  Lab 06/09/19 0451 06/10/19 0404 06/11/19 0430 06/12/19 0543 06/13/19 0430  WBC 3.8* 3.5* 3.6* 3.7* 5.0  NEUTROABS 2.5 2.3 2.2 2.5 3.5  HGB 11.0* 9.5* 9.9* 10.2* 10.1*  HCT 34.0* 28.9* 30.2* 30.2* 29.7*  MCV 94.2 92.9 91.5 91.2 89.5  PLT 167 173 185 213 241   Cardiac Enzymes: No results for input(s): CKTOTAL, CKMB, CKMBINDEX, TROPONINI in the last 168 hours. BNP: BNP (last 3 results) No results for input(s): BNP in the last 8760 hours.  ProBNP (last 3 results) No results for input(s): PROBNP in the last 8760 hours.  CBG: No results for input(s): GLUCAP in the last 168 hours.     Signed:  Alma Friendly, MD [facility] 06/13/2019, 11:22 AM

## 2019-06-13 NOTE — Progress Notes (Signed)
[facility] nurse performs NST on patient which is reactive and reassuring.  Pt reports feeling much better today.  No LOF, vag bleeding, or ucs noted.  Pt informed that Dr Carlis Abbott will call her to update about follow up appt in OBs office.  Dr Carlis Abbott made aware of NST

## 2019-06-14 ENCOUNTER — Encounter (HOSPITAL_COMMUNITY): Payer: Self-pay

## 2019-06-14 LAB — CULTURE, BLOOD (ROUTINE X 2): Culture: NO GROWTH

## 2019-06-24 DIAGNOSIS — Z6839 Body mass index (BMI) 39.0-39.9, adult: Secondary | ICD-10-CM | POA: Diagnosis not present

## 2019-06-24 DIAGNOSIS — Z3480 Encounter for supervision of other normal pregnancy, unspecified trimester: Secondary | ICD-10-CM | POA: Diagnosis not present

## 2019-06-24 DIAGNOSIS — Z23 Encounter for immunization: Secondary | ICD-10-CM | POA: Diagnosis not present

## 2019-07-04 DIAGNOSIS — O99810 Abnormal glucose complicating pregnancy: Secondary | ICD-10-CM | POA: Diagnosis not present

## 2019-07-11 DIAGNOSIS — R000 Tachycardia, unspecified: Secondary | ICD-10-CM | POA: Diagnosis not present

## 2019-07-11 DIAGNOSIS — O98513 Other viral diseases complicating pregnancy, third trimester: Secondary | ICD-10-CM | POA: Diagnosis not present

## 2019-07-11 DIAGNOSIS — U071 COVID-19: Secondary | ICD-10-CM | POA: Diagnosis not present

## 2019-07-11 DIAGNOSIS — Z6841 Body Mass Index (BMI) 40.0 and over, adult: Secondary | ICD-10-CM | POA: Diagnosis not present

## 2019-07-13 ENCOUNTER — Ambulatory Visit: Payer: BC Managed Care – PPO | Admitting: Registered"

## 2019-07-14 DIAGNOSIS — O26843 Uterine size-date discrepancy, third trimester: Secondary | ICD-10-CM | POA: Diagnosis not present

## 2019-07-15 ENCOUNTER — Encounter: Payer: BC Managed Care – PPO | Attending: Obstetrics | Admitting: *Deleted

## 2019-07-15 ENCOUNTER — Other Ambulatory Visit: Payer: Self-pay

## 2019-07-15 DIAGNOSIS — R7302 Impaired glucose tolerance (oral): Secondary | ICD-10-CM | POA: Diagnosis not present

## 2019-07-15 NOTE — Patient Instructions (Signed)
Plan:  Aim for 3 Carb Choices per meal (45 grams) +/- 1 either way  Aim for 0-2 Carbs per snack if hungry  Include protein in moderation with your meals and snacks Continue reading food labels for Total Carbohydrate of foods Continue with your activity level by walking for 30-60 minutes daily as tolerated Checking BG as directed for GDM

## 2019-07-15 NOTE — Progress Notes (Signed)
  Patient was seen on 07/15/2019 for Gestational Diabetes self-management. EDD 08/27/2019. Patient states no history of GDM. She states her husband has Type 1 Diabetes, was diagnosed in the 4th grade. Diet history obtained. Patient eats very good variety of all food groups. Beverages include only water.  The following learning objectives were met by the patient :   States the definition of Gestational Diabetes  States why dietary management is important in controlling blood glucose  Describes the effects of carbohydrates on blood glucose levels  Demonstrates ability to create a balanced meal plan  Demonstrates carbohydrate counting   States when to check blood glucose levels  Demonstrates proper blood glucose monitoring techniques  States the effect of stress and exercise on blood glucose levels  States the importance of limiting caffeine and abstaining from alcohol and smoking  Plan:  Aim for 3 Carb Choices per meal (45 grams) +/- 1 either way  Aim for 1-2 Carbs per snack Begin reading food labels for Total Carbohydrate of foods If OK with your MD, consider  increasing your activity level by walking, Arm Chair Exercises or other activity daily as tolerated Begin checking BG before breakfast and 2 hours after first bite of breakfast, lunch and dinner as directed by MD  Bring Log Book/Sheet and meter to every medical appointment  Take medication if directed by MD  Blood glucose monitor given: [employer] # [id number] Exp: 09/11/2019 Blood glucose reading: 121 mg/dl in less than 2 hours after breakfast  Patient instructed to test pre breakfast and 2 hours each meal as directed by MD  Patient instructed to monitor glucose levels: FBS: 60 - 95 mg/dl 2 hour: <120 mg/dl  Patient received the following handouts:  Nutrition Diabetes and Pregnancy  Carbohydrate Counting List  BG Log Sheet  Patient will be seen for follow-up as needed.

## 2019-07-19 DIAGNOSIS — Z369 Encounter for antenatal screening, unspecified: Secondary | ICD-10-CM | POA: Diagnosis not present

## 2019-08-02 DIAGNOSIS — Z369 Encounter for antenatal screening, unspecified: Secondary | ICD-10-CM | POA: Diagnosis not present

## 2019-08-02 DIAGNOSIS — Z113 Encounter for screening for infections with a predominantly sexual mode of transmission: Secondary | ICD-10-CM | POA: Diagnosis not present

## 2019-08-02 DIAGNOSIS — Z3480 Encounter for supervision of other normal pregnancy, unspecified trimester: Secondary | ICD-10-CM | POA: Diagnosis not present

## 2019-08-02 DIAGNOSIS — Z6841 Body Mass Index (BMI) 40.0 and over, adult: Secondary | ICD-10-CM | POA: Diagnosis not present

## 2019-08-09 ENCOUNTER — Encounter (HOSPITAL_COMMUNITY): Payer: Self-pay

## 2019-08-09 ENCOUNTER — Telehealth (HOSPITAL_COMMUNITY): Payer: Self-pay | Admitting: *Deleted

## 2019-08-09 DIAGNOSIS — Z369 Encounter for antenatal screening, unspecified: Secondary | ICD-10-CM | POA: Diagnosis not present

## 2019-08-09 NOTE — Patient Instructions (Signed)
Arloa Herbig  08/09/2019   Your procedure is scheduled on:  08/22/2019  Arrive at 70 at [employer] on [address] at [facility]  and [employer]. You are invited to use the FREE valet parking or use the Visitor's parking deck.  Pick up the phone at the desk and dial [phone].  Call this number if you have problems the morning of surgery: [phone]  Remember:   Do not eat food:(After Midnight) Desps de medianoche.  Do not drink clear liquids: (After Midnight) Desps de medianoche.  Take these medicines the morning of surgery with A SIP OF WATER:  none   Do not wear jewelry, make-up or nail polish.  Do not wear lotions, powders, or perfumes. Do not wear deodorant.  Do not shave 48 hours prior to surgery.  Do not bring valuables to the hospital.  [facility] is not   responsible for any belongings or valuables brought to the hospital.  Contacts, dentures or bridgework may not be worn into surgery.  Leave suitcase in the car. After surgery it may be brought to your room.  For patients admitted to the hospital, checkout time is 11:00 AM the day of              discharge.      Please read over the following fact sheets that you were given:     Preparing for Surgery

## 2019-08-09 NOTE — Telephone Encounter (Signed)
Preadmission screen  

## 2019-08-10 ENCOUNTER — Encounter (HOSPITAL_COMMUNITY): Payer: Self-pay

## 2019-08-15 ENCOUNTER — Other Ambulatory Visit: Payer: Self-pay | Admitting: Obstetrics and Gynecology

## 2019-08-15 DIAGNOSIS — R000 Tachycardia, unspecified: Secondary | ICD-10-CM | POA: Diagnosis not present

## 2019-08-16 DIAGNOSIS — R55 Syncope and collapse: Secondary | ICD-10-CM | POA: Diagnosis not present

## 2019-08-20 ENCOUNTER — Other Ambulatory Visit: Payer: Self-pay

## 2019-08-20 ENCOUNTER — Other Ambulatory Visit (HOSPITAL_COMMUNITY)
Admission: RE | Admit: 2019-08-20 | Discharge: 2019-08-20 | Disposition: A | Discharge status: Home / Self Care | Payer: BC Managed Care – PPO | Source: Ambulatory Visit | Attending: Obstetrics and Gynecology | Admitting: Obstetrics and Gynecology

## 2019-08-20 DIAGNOSIS — Z01812 Encounter for preprocedural laboratory examination: Secondary | ICD-10-CM | POA: Diagnosis not present

## 2019-08-20 HISTORY — DX: Encounter for other specified aftercare: Z51.89

## 2019-08-20 HISTORY — DX: Gestational diabetes mellitus in pregnancy, unspecified control: O24.419

## 2019-08-20 HISTORY — DX: Personal history of other complications of pregnancy, childbirth and the puerperium: Z87.59

## 2019-08-20 HISTORY — DX: Depression, unspecified: F32.A

## 2019-08-20 LAB — CBC
HCT: 34 % — ABNORMAL LOW (ref 36.0–46.0)
Hemoglobin: 11.1 g/dL — ABNORMAL LOW (ref 12.0–15.0)
MCH: 26.7 pg (ref 26.0–34.0)
MCHC: 32.6 g/dL (ref 30.0–36.0)
MCV: 81.9 fL (ref 80.0–100.0)
Platelets: 265 10*3/uL (ref 150–400)
RBC: 4.15 MIL/uL (ref 3.87–5.11)
RDW: 13.8 % (ref 11.5–15.5)
WBC: 6.5 10*3/uL (ref 4.0–10.5)
nRBC: 0 % (ref 0.0–0.2)

## 2019-08-20 LAB — TYPE AND SCREEN
ABO/RH(D): O NEG
Antibody Screen: NEGATIVE

## 2019-08-20 LAB — ABO/RH: ABO/RH(D): O NEG

## 2019-08-20 LAB — RPR: RPR Ser Ql: NONREACTIVE

## 2019-08-20 NOTE — MAU Note (Addendum)
Pt reports to mau for preop.  Labwork.  Denies any covid symptoms.  No covid test needed per [facility], pt within 90 day time frame of confirmed positive

## 2019-08-21 NOTE — Anesthesia Preprocedure Evaluation (Addendum)
Anesthesia Evaluation  Patient identified by MRN, date of birth, ID band Patient awake    Reviewed: Allergy & Precautions, H&P , NPO status , Patient's Chart, lab work & pertinent test results  Airway Mallampati: II  TM Distance: >3 FB Neck ROM: Full    Dental no notable dental hx.    Pulmonary neg pulmonary ROS,    Pulmonary exam normal breath sounds clear to auscultation       Cardiovascular Exercise Tolerance: Good hypertension, negative cardio ROS Normal cardiovascular exam Rhythm:Regular Rate:Normal     Neuro/Psych negative neurological ROS  negative psych ROS   GI/Hepatic negative GI ROS, Neg liver ROS,   Endo/Other  negative endocrine ROSdiabetes, Gestational  Renal/GU negative Renal ROS  negative genitourinary   Musculoskeletal negative musculoskeletal ROS (+)   Abdominal   Peds negative pediatric ROS (+)  Hematology negative hematology ROS (+) Blood dyscrasia, anemia ,   Anesthesia Other Findings   Reproductive/Obstetrics negative OB ROS                            Anesthesia Physical Anesthesia Plan  ASA: II  Anesthesia Plan: Spinal   Post-op Pain Management:    Induction:   PONV Risk Score and Plan:   Airway Management Planned:   Additional Equipment:   Intra-op Plan:   Post-operative Plan:   Informed Consent: I have reviewed the patients History and Physical, chart, labs and discussed the procedure including the risks, benefits and alternatives for the proposed anesthesia with the patient or authorized representative who has indicated his/her understanding and acceptance.     Dental Advisory Given  Plan Discussed with: Anesthesiologist  Anesthesia Plan Comments: (  )        Anesthesia Quick Evaluation

## 2019-08-22 ENCOUNTER — Inpatient Hospital Stay (HOSPITAL_COMMUNITY): Payer: BC Managed Care – PPO | Admitting: Anesthesiology

## 2019-08-22 ENCOUNTER — Other Ambulatory Visit: Payer: Self-pay

## 2019-08-22 ENCOUNTER — Encounter (HOSPITAL_COMMUNITY)
Admission: RE | Disposition: A | Discharge status: Home / Self Care | Payer: Self-pay | Source: Home / Self Care | Attending: Obstetrics and Gynecology

## 2019-08-22 ENCOUNTER — Encounter (HOSPITAL_COMMUNITY): Payer: Self-pay | Admitting: Obstetrics and Gynecology

## 2019-08-22 ENCOUNTER — Inpatient Hospital Stay (HOSPITAL_COMMUNITY)
Admission: RE | Admit: 2019-08-22 | Discharge: 2019-08-24 | DRG: 788 | Disposition: A | Discharge status: Home / Self Care | Payer: BC Managed Care – PPO | Attending: Obstetrics and Gynecology | Admitting: Obstetrics and Gynecology

## 2019-08-22 DIAGNOSIS — Z7982 Long term (current) use of aspirin: Secondary | ICD-10-CM

## 2019-08-22 DIAGNOSIS — O24420 Gestational diabetes mellitus in childbirth, diet controlled: Secondary | ICD-10-CM | POA: Diagnosis present

## 2019-08-22 DIAGNOSIS — O26893 Other specified pregnancy related conditions, third trimester: Secondary | ICD-10-CM | POA: Diagnosis present

## 2019-08-22 DIAGNOSIS — D649 Anemia, unspecified: Secondary | ICD-10-CM | POA: Diagnosis present

## 2019-08-22 DIAGNOSIS — O34211 Maternal care for low transverse scar from previous cesarean delivery: Secondary | ICD-10-CM | POA: Diagnosis not present

## 2019-08-22 DIAGNOSIS — O9902 Anemia complicating childbirth: Secondary | ICD-10-CM | POA: Diagnosis not present

## 2019-08-22 DIAGNOSIS — Z23 Encounter for immunization: Secondary | ICD-10-CM | POA: Diagnosis not present

## 2019-08-22 DIAGNOSIS — Z6791 Unspecified blood type, Rh negative: Secondary | ICD-10-CM | POA: Diagnosis not present

## 2019-08-22 DIAGNOSIS — O99214 Obesity complicating childbirth: Secondary | ICD-10-CM | POA: Diagnosis present

## 2019-08-22 DIAGNOSIS — O34212 Maternal care for vertical scar from previous cesarean delivery: Secondary | ICD-10-CM | POA: Diagnosis not present

## 2019-08-22 DIAGNOSIS — O24429 Gestational diabetes mellitus in childbirth, unspecified control: Secondary | ICD-10-CM | POA: Diagnosis not present

## 2019-08-22 DIAGNOSIS — Z3A39 39 weeks gestation of pregnancy: Secondary | ICD-10-CM | POA: Diagnosis not present

## 2019-08-22 DIAGNOSIS — Z3A00 Weeks of gestation of pregnancy not specified: Secondary | ICD-10-CM | POA: Diagnosis not present

## 2019-08-22 DIAGNOSIS — Z0542 Observation and evaluation of newborn for suspected metabolic condition ruled out: Secondary | ICD-10-CM | POA: Diagnosis not present

## 2019-08-22 LAB — GLUCOSE, CAPILLARY
Glucose-Capillary: 84 mg/dL (ref 70–99)
Glucose-Capillary: 81 mg/dL (ref 70–99)

## 2019-08-22 SURGERY — Surgical Case
Anesthesia: Spinal | Site: Abdomen | Wound class: Clean Contaminated

## 2019-08-22 MED ORDER — SCOPOLAMINE 1 MG/3DAYS TD PT72: 1.0000 | MEDICATED_PATCH | Freq: Once | TRANSDERMAL | Status: DC

## 2019-08-22 MED ORDER — ONDANSETRON HCL 4 MG/2ML IJ SOLN: 4.0000 mg | Freq: Once | INTRAMUSCULAR | Status: DC | PRN

## 2019-08-22 MED ORDER — OXYCODONE HCL 5 MG PO TABS: 5.0000 mg | ORAL_TABLET | Freq: Once | ORAL | Status: DC | PRN

## 2019-08-22 MED ORDER — SODIUM CHLORIDE 0.9% FLUSH: 3.0000 mL | INTRAVENOUS | Status: DC | PRN

## 2019-08-22 MED ORDER — NALBUPHINE HCL 10 MG/ML IJ SOLN: 5.0000 mg | Freq: Once | INTRAMUSCULAR | Status: DC | PRN

## 2019-08-22 MED ORDER — SCOPOLAMINE 1 MG/3DAYS TD PT72
1.0000 | MEDICATED_PATCH | TRANSDERMAL | Status: DC
  Administered 2019-08-22: 12:00:00 1.5 mg via TRANSDERMAL

## 2019-08-22 MED ORDER — ONDANSETRON HCL 4 MG/2ML IJ SOLN
4.0000 mg | Freq: Once | INTRAMUSCULAR | Status: AC
  Administered 2019-08-22: 12:00:00 4 mg via INTRAVENOUS

## 2019-08-22 MED ORDER — COCONUT OIL OIL: 1.0000 "application " | TOPICAL_OIL | Status: DC | PRN

## 2019-08-22 MED ORDER — DEXAMETHASONE SODIUM PHOSPHATE 10 MG/ML IJ SOLN
INTRAMUSCULAR | Status: DC | PRN
  Administered 2019-08-22: 10 mg via INTRAVENOUS

## 2019-08-22 MED ORDER — SODIUM CHLORIDE 0.9 % IV SOLN: INTRAVENOUS | Status: DC | PRN

## 2019-08-22 MED ORDER — OXYTOCIN 40 UNITS IN NORMAL SALINE INFUSION - SIMPLE MED
INTRAVENOUS | Status: AC
  Filled 2019-08-22: qty 1000

## 2019-08-22 MED ORDER — WITCH HAZEL-GLYCERIN EX PADS: 1.0000 "application " | MEDICATED_PAD | CUTANEOUS | Status: DC | PRN

## 2019-08-22 MED ORDER — TETANUS-DIPHTH-ACELL PERTUSSIS 5-2.5-18.5 LF-MCG/0.5 IM SUSP: 0.5000 mL | Freq: Once | INTRAMUSCULAR | Status: DC

## 2019-08-22 MED ORDER — PRENATAL MULTIVITAMIN CH
1.0000 | ORAL_TABLET | Freq: Every day | ORAL | Status: DC
  Administered 2019-08-23: 14:00:00 1 via ORAL
  Filled 2019-08-22: qty 1

## 2019-08-22 MED ORDER — LACTATED RINGERS IV SOLN: INTRAVENOUS | Status: DC

## 2019-08-22 MED ORDER — KETOROLAC TROMETHAMINE 30 MG/ML IJ SOLN
INTRAMUSCULAR | Status: AC
  Filled 2019-08-22: qty 1

## 2019-08-22 MED ORDER — SIMETHICONE 80 MG PO CHEW
80.0000 mg | CHEWABLE_TABLET | ORAL | Status: DC
  Administered 2019-08-22 – 2019-08-23 (×2): 80 mg via ORAL
  Filled 2019-08-22 (×2): qty 1

## 2019-08-22 MED ORDER — KETOROLAC TROMETHAMINE 30 MG/ML IJ SOLN
30.0000 mg | Freq: Four times a day (QID) | INTRAMUSCULAR | Status: AC | PRN
  Administered 2019-08-22: 14:00:00 30 mg via INTRAVENOUS

## 2019-08-22 MED ORDER — MEPERIDINE HCL 25 MG/ML IJ SOLN
INTRAMUSCULAR | Status: AC
  Filled 2019-08-22: qty 1

## 2019-08-22 MED ORDER — DIPHENHYDRAMINE HCL 50 MG/ML IJ SOLN
12.5000 mg | INTRAMUSCULAR | Status: DC | PRN
  Administered 2019-08-22: 13:00:00 12.5 mg via INTRAVENOUS

## 2019-08-22 MED ORDER — DEXAMETHASONE SODIUM PHOSPHATE 10 MG/ML IJ SOLN
INTRAMUSCULAR | Status: AC
  Filled 2019-08-22: qty 1

## 2019-08-22 MED ORDER — SODIUM CHLORIDE 0.9 % IR SOLN
Status: DC | PRN
  Administered 2019-08-22: 1000 mL

## 2019-08-22 MED ORDER — SENNOSIDES-DOCUSATE SODIUM 8.6-50 MG PO TABS
2.0000 | ORAL_TABLET | ORAL | Status: DC
  Administered 2019-08-22 – 2019-08-23 (×2): 2 via ORAL
  Filled 2019-08-22 (×2): qty 2

## 2019-08-22 MED ORDER — NALBUPHINE HCL 10 MG/ML IJ SOLN: 5.0000 mg | INTRAMUSCULAR | Status: DC | PRN

## 2019-08-22 MED ORDER — ACETAMINOPHEN 160 MG/5ML PO SOLN: 325.0000 mg | ORAL | Status: DC | PRN

## 2019-08-22 MED ORDER — LACTATED RINGERS IV SOLN: INTRAVENOUS | Status: DC | PRN

## 2019-08-22 MED ORDER — FENTANYL CITRATE (PF) 100 MCG/2ML IJ SOLN: 25.0000 ug | INTRAMUSCULAR | Status: DC | PRN

## 2019-08-22 MED ORDER — PHENYLEPHRINE HCL-NACL 20-0.9 MG/250ML-% IV SOLN
INTRAVENOUS | Status: AC
  Filled 2019-08-22: qty 250

## 2019-08-22 MED ORDER — MORPHINE SULFATE (PF) 0.5 MG/ML IJ SOLN
INTRAMUSCULAR | Status: AC
  Filled 2019-08-22: qty 10

## 2019-08-22 MED ORDER — ACETAMINOPHEN 325 MG PO TABS: 325.0000 mg | ORAL_TABLET | ORAL | Status: DC | PRN

## 2019-08-22 MED ORDER — OXYCODONE HCL 5 MG/5ML PO SOLN: 5.0000 mg | Freq: Once | ORAL | Status: DC | PRN

## 2019-08-22 MED ORDER — STERILE WATER FOR IRRIGATION IR SOLN
Status: DC | PRN
  Administered 2019-08-22: 1000 mL

## 2019-08-22 MED ORDER — PHENYLEPHRINE HCL-NACL 20-0.9 MG/250ML-% IV SOLN
INTRAVENOUS | Status: DC | PRN
  Administered 2019-08-22: 60 ug/min via INTRAVENOUS

## 2019-08-22 MED ORDER — ONDANSETRON HCL 4 MG/2ML IJ SOLN
INTRAMUSCULAR | Status: AC
  Filled 2019-08-22: qty 2

## 2019-08-22 MED ORDER — OXYTOCIN 40 UNITS IN NORMAL SALINE INFUSION - SIMPLE MED
INTRAVENOUS | Status: DC | PRN
  Administered 2019-08-22: 40 mL via INTRAVENOUS

## 2019-08-22 MED ORDER — OXYCODONE-ACETAMINOPHEN 5-325 MG PO TABS
1.0000 | ORAL_TABLET | ORAL | Status: DC | PRN
  Administered 2019-08-23: 23:00:00 1 via ORAL
  Administered 2019-08-24: 08:00:00 2 via ORAL
  Filled 2019-08-22: qty 1
  Filled 2019-08-22: qty 2

## 2019-08-22 MED ORDER — FENTANYL CITRATE (PF) 100 MCG/2ML IJ SOLN
INTRAMUSCULAR | Status: AC
  Filled 2019-08-22: qty 2

## 2019-08-22 MED ORDER — KETOROLAC TROMETHAMINE 30 MG/ML IJ SOLN: 30.0000 mg | Freq: Four times a day (QID) | INTRAMUSCULAR | Status: AC | PRN

## 2019-08-22 MED ORDER — MORPHINE SULFATE (PF) 0.5 MG/ML IJ SOLN
INTRAMUSCULAR | Status: DC | PRN
  Administered 2019-08-22: 150 ug via INTRATHECAL

## 2019-08-22 MED ORDER — SCOPOLAMINE 1 MG/3DAYS TD PT72
MEDICATED_PATCH | TRANSDERMAL | Status: AC
  Filled 2019-08-22: qty 1

## 2019-08-22 MED ORDER — DIPHENHYDRAMINE HCL 50 MG/ML IJ SOLN
INTRAMUSCULAR | Status: AC
  Filled 2019-08-22: qty 1

## 2019-08-22 MED ORDER — MENTHOL 3 MG MT LOZG: 1.0000 | LOZENGE | OROMUCOSAL | Status: DC | PRN

## 2019-08-22 MED ORDER — IBUPROFEN 800 MG PO TABS
800.0000 mg | ORAL_TABLET | Freq: Three times a day (TID) | ORAL | Status: DC
  Administered 2019-08-22 – 2019-08-24 (×5): 800 mg via ORAL
  Filled 2019-08-22 (×5): qty 1

## 2019-08-22 MED ORDER — DIBUCAINE (PERIANAL) 1 % EX OINT: 1.0000 "application " | TOPICAL_OINTMENT | CUTANEOUS | Status: DC | PRN

## 2019-08-22 MED ORDER — DIPHENHYDRAMINE HCL 25 MG PO CAPS: 25.0000 mg | ORAL_CAPSULE | ORAL | Status: DC | PRN

## 2019-08-22 MED ORDER — ONDANSETRON HCL 4 MG/2ML IJ SOLN: 4.0000 mg | Freq: Three times a day (TID) | INTRAMUSCULAR | Status: DC | PRN

## 2019-08-22 MED ORDER — ACETAMINOPHEN 325 MG PO TABS
650.0000 mg | ORAL_TABLET | ORAL | Status: DC | PRN
  Administered 2019-08-23: 14:00:00 650 mg via ORAL
  Filled 2019-08-22: qty 2

## 2019-08-22 MED ORDER — MEPERIDINE HCL 25 MG/ML IJ SOLN
6.2500 mg | INTRAMUSCULAR | Status: DC | PRN
  Administered 2019-08-22: 14:00:00 6.25 mg via INTRAVENOUS

## 2019-08-22 MED ORDER — ONDANSETRON HCL 4 MG/2ML IJ SOLN
INTRAMUSCULAR | Status: DC | PRN
  Administered 2019-08-22: 4 mg via INTRAVENOUS

## 2019-08-22 MED ORDER — CEFAZOLIN SODIUM-DEXTROSE 2-4 GM/100ML-% IV SOLN
INTRAVENOUS | Status: AC
  Filled 2019-08-22: qty 100

## 2019-08-22 MED ORDER — NALOXONE HCL 4 MG/10ML IJ SOLN
1.0000 ug/kg/h | INTRAVENOUS | Status: DC | PRN
  Filled 2019-08-22: qty 5

## 2019-08-22 MED ORDER — CEFAZOLIN SODIUM-DEXTROSE 2-4 GM/100ML-% IV SOLN
2.0000 g | INTRAVENOUS | Status: AC
  Administered 2019-08-22: 11:00:00 2 g via INTRAVENOUS

## 2019-08-22 MED ORDER — DIPHENHYDRAMINE HCL 25 MG PO CAPS: 25.0000 mg | ORAL_CAPSULE | Freq: Four times a day (QID) | ORAL | Status: DC | PRN

## 2019-08-22 MED ORDER — MEPERIDINE HCL 25 MG/ML IJ SOLN
6.2500 mg | INTRAMUSCULAR | Status: DC | PRN
  Administered 2019-08-22: 14:00:00 12.5 mg via INTRAVENOUS

## 2019-08-22 MED ORDER — ZOLPIDEM TARTRATE 5 MG PO TABS: 5.0000 mg | ORAL_TABLET | Freq: Every evening | ORAL | Status: DC | PRN

## 2019-08-22 MED ORDER — OXYTOCIN 40 UNITS IN NORMAL SALINE INFUSION - SIMPLE MED: 2.5000 [IU]/h | INTRAVENOUS | Status: AC

## 2019-08-22 MED ORDER — SIMETHICONE 80 MG PO CHEW
80.0000 mg | CHEWABLE_TABLET | Freq: Three times a day (TID) | ORAL | Status: DC
  Administered 2019-08-23 – 2019-08-24 (×4): 80 mg via ORAL
  Filled 2019-08-22 (×5): qty 1

## 2019-08-22 MED ORDER — FENTANYL CITRATE (PF) 100 MCG/2ML IJ SOLN
INTRAMUSCULAR | Status: DC | PRN
  Administered 2019-08-22: 15 ug via INTRATHECAL

## 2019-08-22 MED ORDER — NALOXONE HCL 0.4 MG/ML IJ SOLN: 0.4000 mg | INTRAMUSCULAR | Status: DC | PRN

## 2019-08-22 MED ORDER — SIMETHICONE 80 MG PO CHEW: 80.0000 mg | CHEWABLE_TABLET | ORAL | Status: DC | PRN

## 2019-08-22 SURGICAL SUPPLY — 34 items
BENZOIN TINCTURE PRP APPL 2/3 (GAUZE/BANDAGES/DRESSINGS) ×2 IMPLANT
CHLORAPREP W/TINT 26ML (MISCELLANEOUS) ×2 IMPLANT
CLAMP CORD UMBIL (MISCELLANEOUS) ×2 IMPLANT
CLOTH BEACON ORANGE TIMEOUT ST (SAFETY) ×2 IMPLANT
DRSG OPSITE POSTOP 4X10 (GAUZE/BANDAGES/DRESSINGS) ×2 IMPLANT
ELECT REM PT RETURN 9FT ADLT (ELECTROSURGICAL) ×2
ELECTRODE REM PT RTRN 9FT ADLT (ELECTROSURGICAL) ×1 IMPLANT
EXTRACTOR VACUUM M CUP 4 TUBE (SUCTIONS) IMPLANT
GLOVE BIOGEL PI IND STRL 6.5 (GLOVE) ×1 IMPLANT
GLOVE BIOGEL PI IND STRL 7.0 (GLOVE) ×1 IMPLANT
GLOVE BIOGEL PI INDICATOR 6.5 (GLOVE) ×1
GLOVE BIOGEL PI INDICATOR 7.0 (GLOVE) ×1
GLOVE ECLIPSE 6.5 STRL STRAW (GLOVE) ×2 IMPLANT
GOWN STRL REUS W/TWL LRG LVL3 (GOWN DISPOSABLE) ×4 IMPLANT
HEMOSTAT ARISTA ABSORB 3G PWDR (HEMOSTASIS) ×2 IMPLANT
KIT ABG SYR 3ML LUER SLIP (SYRINGE) IMPLANT
NEEDLE HYPO 25X5/8 SAFETYGLIDE (NEEDLE) IMPLANT
NS IRRIG 1000ML POUR BTL (IV SOLUTION) ×2 IMPLANT
PACK C SECTION WH (CUSTOM PROCEDURE TRAY) ×2 IMPLANT
PAD ABD 7.5X8 STRL (GAUZE/BANDAGES/DRESSINGS) IMPLANT
PAD OB MATERNITY 4.3X12.25 (PERSONAL CARE ITEMS) ×2 IMPLANT
PENCIL SMOKE EVAC W/HOLSTER (ELECTROSURGICAL) ×2 IMPLANT
RTRCTR C-SECT PINK 25CM LRG (MISCELLANEOUS) ×2 IMPLANT
STRIP CLOSURE SKIN 1/2X4 (GAUZE/BANDAGES/DRESSINGS) ×2 IMPLANT
SUT MON AB 2-0 CT1 27 (SUTURE) ×2 IMPLANT
SUT MON AB 4-0 PS1 27 (SUTURE) IMPLANT
SUT PDS AB 0 CTX 60 (SUTURE) ×2 IMPLANT
SUT PLAIN 2 0 XLH (SUTURE) ×2 IMPLANT
SUT VIC AB 0 CTX 36 (SUTURE) ×6
SUT VIC AB 0 CTX36XBRD ANBCTRL (SUTURE) ×6 IMPLANT
SUT VIC AB 4-0 KS 27 (SUTURE) ×2 IMPLANT
TOWEL OR 17X24 6PK STRL BLUE (TOWEL DISPOSABLE) ×2 IMPLANT
TRAY FOLEY W/BAG SLVR 14FR LF (SET/KITS/TRAYS/PACK) ×2 IMPLANT
WATER STERILE IRR 1000ML POUR (IV SOLUTION) ×2 IMPLANT

## 2019-08-22 NOTE — Anesthesia Procedure Notes (Signed)
Spinal  Patient location during procedure: OR Start time: 08/22/2019 11:54 AM End time: 08/22/2019 11:58 AM Staffing Performed: other anesthesia staff and anesthesiologist  Anesthesiologist: Bethena Midget, MD Other anesthesia staff: Garnette Scheuermann, RN Preanesthetic Checklist Completed: patient identified, IV checked, site marked, risks and benefits discussed, surgical consent, monitors and equipment checked, pre-op evaluation and timeout performed Spinal Block Patient position: sitting Prep: ChloraPrep Patient monitoring: heart rate, continuous pulse ox and blood pressure Approach: midline Location: L3-4 Injection technique: single-shot Needle Needle type: Pencan  Needle gauge: 24 G Assessment Sensory level: T4

## 2019-08-22 NOTE — H&P (Signed)
24 y.o. [redacted]w[redacted]d  G2P0101 comes in for schedule desired repeat c/s.  Prior c/s after IOL for severe PEC resulted in arrest of dilation.  Per pt was traumatic experience, received 2U PRBCs.  Stayed on cardiac unit for 4 days.  Has been on daily baby aspirin for risk factors. Otherwise has good fetal movement and no bleeding.  Past Medical History:  Diagnosis Date  . Blood transfusion without reported diagnosis   . Depression    PPD  . Gestational diabetes   . History of gestational hypertension   . Tachycardia 07/05/2017   postpartum, spent 1 week in hospital, no further diagnosis    Past Surgical History:  Procedure Laterality Date  . BREAST SURGERY    . CESAREAN SECTION      OB History  Gravida Para Term Preterm AB Living  2 1 0 1 0 1  SAB TAB Ectopic Multiple Live Births  0 0 0 0 1    # Outcome Date GA Lbr Len/2nd Weight Sex Delivery Anes PTL Lv  2 Current           1 Preterm      CS-LTranv       Social History   Socioeconomic History  . Marital status: Married    Spouse name: Not on file  . Number of children: Not on file  . Years of education: Not on file  . Highest education level: Not on file  Occupational History  . Not on file  Tobacco Use  . Smoking status: Never Smoker  . Smokeless tobacco: Never Used  Substance and Sexual Activity  . Alcohol use: Not on file  . Drug use: Not on file  . Sexual activity: Not on file  Other Topics Concern  . Not on file  Social History Narrative  . Not on file   Social Determinants of Health   Financial Resource Strain:   . Difficulty of Paying Living Expenses: Not on file  Food Insecurity:   . Worried About Charity fundraiser in the Last Year: Not on file  . Ran Out of Food in the Last Year: Not on file  Transportation Needs:   . Lack of Transportation (Medical): Not on file  . Lack of Transportation (Non-Medical): Not on file  Physical Activity:   . Days of Exercise per Week: Not on file  . Minutes of Exercise per  Session: Not on file  Stress:   . Feeling of Stress : Not on file  Social Connections:   . Frequency of Communication with Friends and Family: Not on file  . Frequency of Social Gatherings with Friends and Family: Not on file  . Attends Religious Services: Not on file  . Active Member of Clubs or Organizations: Not on file  . Attends Archivist Meetings: Not on file  . Marital Status: Not on file  Intimate Partner Violence:   . Fear of Current or Ex-Partner: Not on file  . Emotionally Abused: Not on file  . Physically Abused: Not on file  . Sexually Abused: Not on file   Patient has no known allergies.    Prenatal Transfer Tool  Maternal Diabetes: Yes:  Diabetes Type:  Diet controlled Genetic Screening: Declined Maternal Ultrasounds/Referrals: Normal Fetal Ultrasounds or other Referrals:  None Maternal Substance Abuse:  No Significant Maternal Medications:  None Significant Maternal Lab Results: Group B Strep negative  Other PNC: morbid obesity, Rh negative, GDMA1, Covid admission 10/27 for several days d/t SOB.  Thereafter pt complained of persistent SOB and had cardiac monitoring by PCP but result not received by time of this note.  Our office proceeded to refer to cardiology.    Vitals:   08/22/19 0624 08/22/19 1023 08/22/19 1026  BP:  (!) 123/91   Pulse:  88   Resp:  20   Temp:  98 F (36.7 C)   TempSrc:  Oral   SpO2:  100%   Weight: 110.2 kg  110.2 kg  Height: 5\' 4"  (1.626 m)  5\' 4"  (1.626 m)    PE: NAD ABd: gravid NT FHT: pending RN assessment   A/P   Admit for scheduled repeat c/s  GBS neg  2g Ancef pre-op  Other routine care  

## 2019-08-22 NOTE — Transfer of Care (Signed)
Immediate Anesthesia Transfer of Care Note  Patient: Marissa Andrade  Procedure(s) Performed: CESAREAN SECTION (N/A Abdomen)  Patient Location: PACU  Anesthesia Type:Spinal  Level of Consciousness: awake  Airway & Oxygen Therapy: Patient Spontanous Breathing  Post-op Assessment: Report given to RN and Post -op Vital signs reviewed and stable  Post vital signs: stable  Last Vitals:  Vitals Value Taken Time  BP 101/55 08/22/19 1311  Temp    Pulse 88 08/22/19 1315  Resp 15 08/22/19 1315  SpO2 99 % 08/22/19 1315  Vitals shown include unvalidated device data.  Last Pain:  Vitals:   08/22/19 1023  TempSrc: Oral         Complications: No apparent anesthesia complications

## 2019-08-22 NOTE — Lactation Note (Signed)
This note was copied from a baby's chart. Lactation Consultation Note  Patient Name: Girl Lacrystal Barbe Today's Date: 08/22/2019 Reason for consult: Follow-up assessment;Mother's request  Ms. Ether Griffins requested lactation assistance via her Charity fundraiser. I entered the room shortly after receiving the page, and Ms. Shek had latched Gracelynn on her right breast in football hold. Rhythmic suckling sequences noted. Baby appeared to be slowing down and becoming sleepy. Ms. Loomer support person was helping to gently "pester" baby to stay active.  I praised Ms. Gaus for her efforts and encouraged her to continue to breast feed baby on demand 8-12 times a day.  Maternal Data Formula Feeding for Exclusion: No Has patient been taught Hand Expression?: Yes Does the patient have breastfeeding experience prior to this delivery?: Yes  Feeding Feeding Type: Breast Fed  LATCH Score Latch: Repeated attempts needed to sustain latch, nipple held in mouth throughout feeding, stimulation needed to elicit sucking reflex.  Audible Swallowing: A few with stimulation  Type of Nipple: Everted at rest and after stimulation  Comfort (Breast/Nipple): Soft / non-tender  Hold (Positioning): Assistance needed to correctly position infant at breast and maintain latch.  LATCH Score: 7  Interventions Interventions: DEBP;Breast feeding basics reviewed;Hand express  Lactation Tools Discussed/Used Pump Review: Setup, frequency, and cleaning;Milk Storage Initiated by:: Walker Shadow Date initiated:: 08/22/19   Consult Status Consult Status: Follow-up Date: 08/23/19 Follow-up type: In-patient    Walker Shadow 08/22/2019, 9:45 PM

## 2019-08-22 NOTE — Lactation Note (Signed)
This note was copied from a baby's chart. Lactation Consultation Note  Patient Name: Marissa Andrade Today's Date: 08/22/2019 Reason for consult: Initial assessment  Baby "Marissa Andrade" is a FTI born 7 hours ago via c-section. Mom is a 24 yr old, P2 and has prior breastfeeding experience. Mom stated that she was separated from her first child for 5 days as he is was in NICU and she was in the Cardiac unit. She reported that she had a hard time establishing her milk supply and made the choice to stop pumping after two months.   Mom reported that she had positive breast changes and colostrum leakage during her pregnancy. Mom also reported that Marissa Andrade was rooting in recover but unable to latch while there. She states that she breast fed baby about three hours earlier and achieved a good latch.  LC demonstrated hand expression, and mom was able to repeat back the technique, drops of colostrum were noted. LC finger fed Marissa Andrade the drops of expressed milk with a gloved finger. Marissa Andrade was too sleepy to wake to attempt a feed at time of LC's visit.   Mom expressed concern about her milk supply due breast reduction surgery at age 23. LC set up a DEBP in the room after hearing of Ms. Herbison's eagerness to establish a good milk supply and discussed set-up, pumping frequency, and cleaning guidelines. Mom stated that the 24 mm flanges were comfortable; she has short-shafted nipples that evert with stimulation. Mom has a personal, Medela DEBP at home.   LC reviewed normal, newborn behavior, infant stomach size, and output. LC also encouraged mom to feed Marissa Andrade 8-12x in a 24 hours period -- paying attention to her feeding cues. Pump as able and feed any EBM back to baby. LC shared OP services. Mom and FOB expressed that they had no further questions on concerns and will call as needed.   Maternal Data Formula Feeding for Exclusion: No Has patient been taught Hand Expression?: Yes Does the patient have  breastfeeding experience prior to this delivery?: Yes  Feeding Feeding Type: Breast Fed   Interventions Interventions: DEBP;Breast feeding basics reviewed;Hand express  Lactation Tools Discussed/Used Pump Review: Setup, frequency, and cleaning;Milk Storage Initiated by:: Walker Shadow Date initiated:: 08/22/19   Consult Status Consult Status: Follow-up Date: 08/23/19 Follow-up type: In-patient    Walker Shadow 08/22/2019, 9:12 PM

## 2019-08-22 NOTE — Anesthesia Postprocedure Evaluation (Signed)
Anesthesia Post Note  Patient: Marissa Andrade  Procedure(s) Performed: CESAREAN SECTION (N/A Abdomen)     Patient location during evaluation: Mother Baby Anesthesia Type: Spinal Level of consciousness: oriented and awake and alert Pain management: pain level controlled Vital Signs Assessment: post-procedure vital signs reviewed and stable Respiratory status: spontaneous breathing and respiratory function stable Cardiovascular status: blood pressure returned to baseline and stable Postop Assessment: no headache, no backache, no apparent nausea or vomiting and able to ambulate Anesthetic complications: no    Last Vitals:  Vitals:   08/22/19 1415 08/22/19 1427  BP: 113/65 110/75  Pulse: 69 (!) 58  Resp: 14 18  Temp:  36.4 C  SpO2: 100% 99%    Last Pain:  Vitals:   08/22/19 1427  TempSrc: Oral   Pain Goal:    LLE Motor Response: Purposeful movement (08/22/19 1415) LLE Sensation: Tingling (08/22/19 1415) RLE Motor Response: Purposeful movement (08/22/19 1415) RLE Sensation: Tingling (08/22/19 1415)     Epidural/Spinal Function Cutaneous sensation: Tingles (08/22/19 1415), Patient able to flex knees: No (08/22/19 1345), Patient able to lift hips off bed: Yes (08/22/19 1415), Back pain beyond tenderness at insertion site: No (08/22/19 1415), Progressively worsening motor and/or sensory loss: No (08/22/19 1415), Bowel and/or bladder incontinence post epidural: No (08/22/19 1415)  Saavi Mceachron

## 2019-08-22 NOTE — Brief Op Note (Signed)
08/22/2019  1:08 PM  PATIENT:  Beat[age] Stallion y.o. female  PRE-OPERATIVE DIAGNOSIS:  previous cesarean section   POST-OPERATIVE DIAGNOSIS:  previous cesarean section  PROCEDURE:  Procedure(s): CESAREAN SECTION (N/A)- low transverse  SURGEON:  Surgeon(s) and Role:    Claiborne Billings, Luther Parody, DO - Primary    * Marlow Baars, MD - Assisting  ANESTHESIA:   spinal  EBL:  496 mL   FINDINGS: significant amount of filmy and thick adhesions of anterior uterus to anterior abd wall, left ovary and tube encased in filmy adhesions.  Female infant, cephalic presentation, APGARS 7/9, wt pending.  SPECIMEN:  Source of Specimen:  cord blood  DISPOSITION OF SPECIMEN:  N/A  COUNTS:  YES  PLAN OF CARE: Admit to inpatient   PATIENT DISPOSITION:  PACU - hemodynamically stable.   Delay start of Pharmacological VTE agent (>24hrs) due to surgical blood loss or risk of bleeding: not applicable

## 2019-08-23 LAB — CBC
HCT: 26.3 % — ABNORMAL LOW (ref 36.0–46.0)
Hemoglobin: 8.6 g/dL — ABNORMAL LOW (ref 12.0–15.0)
MCH: 27.1 pg (ref 26.0–34.0)
MCHC: 32.7 g/dL (ref 30.0–36.0)
MCV: 83 fL (ref 80.0–100.0)
Platelets: 241 10*3/uL (ref 150–400)
RBC: 3.17 MIL/uL — ABNORMAL LOW (ref 3.87–5.11)
RDW: 13.8 % (ref 11.5–15.5)
WBC: 7.6 10*3/uL (ref 4.0–10.5)
nRBC: 0 % (ref 0.0–0.2)

## 2019-08-23 LAB — BIRTH TISSUE RECOVERY COLLECTION (PLACENTA DONATION)

## 2019-08-23 MED ORDER — FERROUS SULFATE 325 (65 FE) MG PO TABS
325.0000 mg | ORAL_TABLET | Freq: Two times a day (BID) | ORAL | Status: DC
  Administered 2019-08-23 – 2019-08-24 (×3): 325 mg via ORAL
  Filled 2019-08-23 (×3): qty 1

## 2019-08-23 MED ORDER — RHO D IMMUNE GLOBULIN 1500 UNIT/2ML IJ SOSY
300.0000 ug | PREFILLED_SYRINGE | Freq: Once | INTRAMUSCULAR | Status: AC
  Administered 2019-08-23: 300 ug via INTRAVENOUS
  Filled 2019-08-23: qty 2

## 2019-08-23 MED ORDER — MEASLES, MUMPS & RUBELLA VAC IJ SOLR
0.5000 mL | Freq: Once | INTRAMUSCULAR | Status: AC
  Administered 2019-08-24: 11:00:00 0.5 mL via SUBCUTANEOUS
  Filled 2019-08-23: qty 0.5

## 2019-08-23 NOTE — Lactation Note (Signed)
This note was copied from a baby's chart. Lactation Consultation Note  Patient Name: Marissa Andrade Today's Date: 08/23/2019 Reason for consult: Follow-up assessment  P2 mother whose infant is now 39 hours old.  Mother breast fed her first child but was separated due to a NICU admission.  She then pumped and bottle fed for 2 months.  Baby was laying asleep next to mother when I arrived.  Mother informed me that she had cluster fed last night and has not fed since 1130 this morning.  Reassured mother that this was okay and continue to observe for cues.  She fed multiple times last night and is now content.  Advised mother to awaken her when it has been 4 hours, feed STS and call for assistance as needed.  Mother verbalized understanding.  She has been able to hand express drops.  She has a DEBP set up at bedside and I suggested she pump if baby does not feed well at the breast.  Mother is interested in establishing a good milk supply.  Also encouraged continued hand expression.    Mother has a DEBP for home use.  Father present and supportive.   Maternal Data    Feeding    LATCH Score                   Interventions    Lactation Tools Discussed/Used     Consult Status Consult Status: Follow-up Date: 08/24/19 Follow-up type: In-patient    Emmauel Hallums R Shontel Santee 08/23/2019, 2:19 PM

## 2019-08-23 NOTE — Progress Notes (Signed)
  Patient is eating, ambulating, voiding.  Pain control is good.  Vitals:   08/22/19 1930 08/22/19 2330 08/23/19 0330 08/23/19 0836  BP: 124/79  113/60 124/67  Pulse: 87  (!) 58 76  Resp: '18 16 18   '$ Temp: 98.2 F (36.8 C) 98.9 F (37.2 C) 98.6 F (37 C) 98.4 F (36.9 C)  TempSrc: Oral Oral Oral   SpO2: 96% 97% 98% 99%  Weight:      Height:        lungs:   clear to auscultation cor:    RRR Abdomen:  soft, appropriate tenderness, incisions intact and without erythema or exudate ex:    no cords   Lab Results  Component Value Date   WBC 7.6 08/23/2019   HGB 8.6 (L) 08/23/2019   HCT 26.3 (L) 08/23/2019   MCV 83.0 08/23/2019   PLT 241 08/23/2019    --/--/O NEG, O NEG Performed at [facility] [address] [place], [place] [address]  (01/09 0905)/R Nonimmune  A/P    Post operative day 1.  Need MMR.  Iron for anemia.  Routine post op and postpartum care.  Expect d/c  Tomorrow.    Percocet for pain control.

## 2019-08-24 LAB — RH IG WORKUP (INCLUDES ABO/RH)
ABO/RH(D): O NEG
Fetal Screen: NEGATIVE
Gestational Age(Wks): 39.2
Unit division: 0

## 2019-08-24 MED ORDER — IBUPROFEN 600 MG PO TABS: 600.0000 mg | ORAL_TABLET | Freq: Four times a day (QID) | ORAL | 0 refills | Status: DC | PRN

## 2019-08-24 MED ORDER — OXYCODONE-ACETAMINOPHEN 5-325 MG PO TABS: 1.0000 | ORAL_TABLET | Freq: Four times a day (QID) | ORAL | 0 refills | Status: DC | PRN

## 2019-08-24 NOTE — Discharge Summary (Signed)
Obstetric Discharge Summary Reason for Admission: cesarean section Prenatal Procedures: NST and ultrasound Intrapartum Procedures: cesarean: low cervical, transverse Postpartum Procedures: none Complications-Operative and Postpartum: none Hemoglobin  Date Value Ref Range Status  08/23/2019 8.6 (L) 12.0 - 15.0 g/dL Final   HCT  Date Value Ref Range Status  08/23/2019 26.3 (L) 36.0 - 46.0 % Final    Physical Exam:  General: alert, cooperative and appears stated age 24: appropriate Uterine Fundus: firm Incision: healing well DVT Evaluation: No evidence of DVT seen on physical exam.  Discharge Diagnoses: Term Pregnancy-delivered  Discharge Information: Date: 08/24/2019 Activity: pelvic rest Diet: routine Medications: Ibuprofen and Percocet Condition: improved Instructions: refer to practice specific booklet Discharge to: home   Newborn Data: Live born female  Birth Weight: 8 lb 12 oz (3970 g) APGAR: 7, 9  Newborn Delivery   Birth date/time: 08/22/2019 12:18:00 Delivery type: C-Section, Low Transverse Trial of labor: No C-section categorization: Repeat      Home with mother.  Waynard Reeds 08/24/2019, 11:06 AM

## 2019-08-24 NOTE — Clinical Social Work Maternal (Signed)
CLINICAL SOCIAL WORK MATERNAL/CHILD NOTE  Patient Details  Name: Marissa Andrade MRN: [id number] Date of Birth: 09/18/1995  Date:  08/24/2019  Clinical Social Worker Initiating Note:  Uthman Mroczkowski, LCSW Date/Time: Initiated:  08/24/19/0830     Child's Name:  Gracelynn Dec   Biological Parents:  Mother, Father(Carlynn and Tyler Klarich)   Need for Interpreter:  None   Reason for Referral:  Behavioral Health Concerns   Address:  [address] [place] [address]    Phone number:  [phone] (home)     Additional phone number: none   Household Members/Support Persons (HM/SP):   Household Member/Support Person 2   HM/SP Name Relationship DOB or Age  HM/SP -1   Tyler Mcgurk  FOB     HM/SP -2 Victorina Philemon MOB    HM/SP -3   son  son     HM/SP -4        HM/SP -5        HM/SP -6        HM/SP -7        HM/SP -8          Natural Supports (not living in the home):   none reported.   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   Education:  College graduate   Homebound arranged:  n/a  Financial Resources:  Medicaid   Other Resources:      Cultural/Religious Considerations Which May Impact Care:  none reported.   Strengths:  Ability to meet basic needs , Compliance with medical plan , Home prepared for child , Pediatrician chosen   Psychotropic Medications:   none at this time.       Pediatrician:    [place] (including [place] and surronding areas)  Pediatrician List:   [place]    [place]    [place]    [place]    [place] ([facility])  [place]      Pediatrician Fax Number:    Risk Factors/Current Problems:  None   Cognitive State:  Alert , Able to Concentrate , Insightful    Mood/Affect:  Relaxed , Comfortable , Interested    CSW Assessment: CSW consulted as MOB has a history of PPD with her last child. CSW  went to speak with MOB at bedside to address further needs.   Upon entering the room, CSW congratulated MOB and FOB on the birth of infant. CSW advised MOB of the reason for the visit as well as CSW's role. MOB reported that she did have PPD with her oldest son. MOB reported that her symtoms lasted for about 6 months in which she was then placed on medications. MOB reports that her medications at that time were very helpful in managing her PPD. MOB reported that during that time she dealt with feelings of not being attached to infant and just wasn't feeling good. MOB was asked if she has had any of those feelings since giving birth this time and MOB denied any feelings. MOB did report to CSW that she was diagnosed with Bipolar in 2012. MOB reported that the medication in which she was on for PPD helped mange her Bipolar symptoms as well. MOB currently denies feeling SI or HI and reports that she has goo support from her family.   CSW provided MOB with PPD and SIDS education. MOB was given PPD Checklist in order to   keep track of her feelings as they relate to PPD this time. MOB reported that she has all needed items to care for infant with follow up care planned at [facility] in [place].  CSW Plan/Description:  No Further Intervention Required/No Barriers to Discharge, Sudden Infant Death Syndrome (SIDS) Education, Perinatal Mood and Anxiety Disorder (PMADs) Education    Robb Matar, LCSWA 2020/04/07, 9:03 AM

## 2019-08-24 NOTE — Progress Notes (Signed)
Subjective: Postpartum Day 2: Cesarean Delivery Patient reports pain controlled, no nausea or vomiting, ambulating without difficulty, + BM   Objective: Vital signs in last 24 hours: Temp:  [98.1 F (36.7 C)-98.4 F (36.9 C)] 98.1 F (36.7 C) (01/13 0549) Pulse Rate:  [72] 72 (01/13 0549) Resp:  [18-19] 19 (01/13 0549) BP: (114-123)/(65-66) 123/66 (01/13 0549) SpO2:  [100 %] 100 % (01/13 0549)  Physical Exam:  General: alert, cooperative and appears stated age Lochia: appropriate Uterine Fundus: firm Incision: healing well DVT Evaluation: No evidence of DVT seen on physical exam.  Recent Labs    08/23/19 0603  HGB 8.6*  HCT 26.3*    Assessment/Plan: Status post Cesarean section. Doing well postoperatively.  Continue current care. Desires D/C home. F/U in office in 4 weeks  Waynard Reeds 08/24/2019, 11:02 AM

## 2019-08-24 NOTE — Lactation Note (Signed)
This note was copied from a baby's chart. Lactation Consultation Note  Patient Name: Marissa Andrade Today's Date: 08/24/2019   Mother states breastfeeding is going well. She does have a small blister on the tip of her R nipple. Provided comfort gels and suggest she make sure baby latches deep on breast, reposition if needed. Feed on demand with cues.  Goal 8-12+ times per day after first 24 hrs.  Place baby STS if not cueing.  Reviewed engorgement care and monitoring voids/stools.      Maternal Data    Feeding Feeding Type: Breast Fed  LATCH Score Latch: Grasps breast easily, tongue down, lips flanged, rhythmical sucking.  Audible Swallowing: A few with stimulation  Type of Nipple: Everted at rest and after stimulation  Comfort (Breast/Nipple): Soft / non-tender  Hold (Positioning): Assistance needed to correctly position infant at breast and maintain latch.  LATCH Score: 8  Interventions Interventions: Hand express;Support pillows  Lactation Tools Discussed/Used     Consult Status      Hardie Pulley 08/24/2019, 10:19 AM

## 2019-08-28 NOTE — Op Note (Signed)
Marissa Andrade, REVOLORIO MEDICAL RECORD SJ:[id number] ACCOUNT [id number] DATE OF BIRTH:1995/12/29 FACILITY: [facility] LOCATION: [facility] PHYSICIAN:Jaydien [place] Adah Perl, DO  OPERATIVE REPORT  DATE OF PROCEDURE:  08/22/2019  PROCEDURE:  Low transverse cesarean section.   PREOPERATIVE DIAGNOSIS:  Prior cesarean section.  POSTOPERATIVE DIAGNOSIS:  Prior cesarean section.   SURGEON:  Philip Aspen, DO  ASSISTING PHYSICIAN:  Felipa Evener, MD  ANESTHESIA:  Spinal.  ESTIMATED BLOOD LOSS:  496 mL.  FINDINGS:  Significant amount of filmy and thick adhesions of the anterior uterus to anterior abdominal wall preventing placement of Alexis self-retractor.  Left ovary and tube encased with filmy adhesions.  Right ovary and tube normal appearance.   Female infant, cephalic presentation, Apgars 7 and 9, weight pending.  SPECIMENS:  Cord blood.  COMPLICATIONS:  None.  DESCRIPTION OF PROCEDURE:  The patient was taken to the operating room where spinal anesthesia was administered and found to be adequate.  She was prepped and draped in the normal sterile fashion in dorsal supine position with a leftward tilt.  A scalpel  was used to make a Pfannenstiel skin incision, which was carried down to underlying fascia with Bovie cautery.  The fascia was incised in the midline with the scalpel and extended laterally with Mayo scissors.  Kocher clamps were placed at the superior  aspect of the fascial incision.  Rectus muscles were dissected off bluntly and sharply.  Kocher clamps were then placed at the inferior aspect of the fascial incision and rectus muscles again dissected off bluntly and sharply.  Hemostat was used to  separate rectus muscle superiorly and with good visualization of the peritoneum, it was entered sharply with Metzenbaum scissors.  A finger was used to sweep underlying area and filmy adhesions were immediately noted.  A small amount of gentle lateral  traction was used to enlarge the peritoneal  incision.  Additional filmy adhesions were taken down bluntly with good visualization and no bleeding noted.  Due to the degree of adhesions, an Pharmacologist was not placed.  A bladder blade was  inserted after additional lateral traction to enlarge the fascial incision.  Bovie cautery was used to extend and remove additional filmy adhesions.  The vesicouterine peritoneum was identified, entered sharply with Metzenbaum scissors and extended  laterally with Metzenbaum scissors.  The bladder flap was created digitally.  A scalpel was used to make a low transverse cesarean incision and the amniotic sac was entered sharply with clear fluid noted.  The incision was extended by cephalic and caudal  traction.  Infant's head was easily located and delivered without difficulty, followed by the remainder of the infant's body.  While infant was bulb suctioned and dried, the hysterotomy was examined and T clamps placed for hemostasis.  At 1 minute, the  cord was clamped and cut and the infant was handed to awaiting neonatology.  Cord blood was collected and external massage of the uterus was performed with gentle traction on the umbilical cord to facilitate delivery of placenta.  The uterus was cleared  of all clot and debris and the uterine incision was closed with Vicryl in a running locked fashion with a second 0 Vicryl suture used to repair an extension that was noted inferiorly medial to the angle on the left side.  A second layer of vertical  imbrication was performed with good hemostasis.  However, the extension continued to have some bleeding.  Additional imbrication was performed over that extension with good visualization of bladder.  Pressure was applied and  an attempt at visualization  of both ovaries and tubes was performed with results noted above.  Upon reexamination, hemostasis the entire length of the hysterotomy incision was excellent.  Arista clotting agent was applied over the extension with  remaining powder sprinkled over the  length of the hysterotomy.  The peritoneum was involved in the anterior abdominal wall adhesive tissue and could not be reapproximated.  Rectus muscles were closely aligned.  It was felt that no further approximation with suture was needed.  The fascia  was reapproximated and closed with Vicryl in a running locked fashion.  Subcutaneous tissue was irrigated, dried and minimal use of Bovie cautery as needed for small bleeders.  Chromic on a large needle was used to close subcutaneous tissue in 5  interrupted sutures.  Skin was reapproximated and closed with Vicryl on a Keith needle.  The patient tolerated the procedure well.  Sponge, lap and needle counts were correct x2.  The patient was taken to recovery in stable condition.  VN/NUANCE  D:08/26/2019 T:08/26/2019 JOB:[id number]

## 2019-09-01 DIAGNOSIS — F53 Postpartum depression: Secondary | ICD-10-CM | POA: Diagnosis not present

## 2019-09-05 ENCOUNTER — Encounter: Payer: Self-pay | Admitting: *Deleted

## 2019-09-05 DIAGNOSIS — F319 Bipolar disorder, unspecified: Secondary | ICD-10-CM | POA: Diagnosis not present

## 2019-09-09 ENCOUNTER — Ambulatory Visit: Payer: BC Managed Care – PPO | Admitting: Cardiology

## 2019-10-02 DIAGNOSIS — O99345 Other mental disorders complicating the puerperium: Secondary | ICD-10-CM | POA: Diagnosis not present

## 2019-10-02 DIAGNOSIS — R45851 Suicidal ideations: Secondary | ICD-10-CM | POA: Diagnosis not present

## 2019-10-03 ENCOUNTER — Encounter: Payer: Self-pay | Admitting: Behavioral Health

## 2019-10-03 ENCOUNTER — Other Ambulatory Visit: Payer: Self-pay

## 2019-10-03 ENCOUNTER — Inpatient Hospital Stay
Admission: AD | Admit: 2019-10-03 | Discharge: 2019-10-05 | DRG: 776 | Disposition: A | Discharge status: Home / Self Care | Payer: BC Managed Care – PPO | Attending: Psychiatry | Admitting: Psychiatry

## 2019-10-03 DIAGNOSIS — F53 Postpartum depression: Secondary | ICD-10-CM | POA: Diagnosis not present

## 2019-10-03 DIAGNOSIS — G4700 Insomnia, unspecified: Secondary | ICD-10-CM | POA: Diagnosis not present

## 2019-10-03 DIAGNOSIS — F319 Bipolar disorder, unspecified: Secondary | ICD-10-CM | POA: Diagnosis not present

## 2019-10-03 DIAGNOSIS — Z79899 Other long term (current) drug therapy: Secondary | ICD-10-CM | POA: Diagnosis not present

## 2019-10-03 DIAGNOSIS — O99345 Other mental disorders complicating the puerperium: Principal | ICD-10-CM | POA: Diagnosis present

## 2019-10-03 DIAGNOSIS — R45851 Suicidal ideations: Secondary | ICD-10-CM | POA: Diagnosis not present

## 2019-10-03 DIAGNOSIS — F3162 Bipolar disorder, current episode mixed, moderate: Secondary | ICD-10-CM | POA: Diagnosis not present

## 2019-10-03 MED ORDER — HYDROXYZINE HCL 25 MG PO TABS
25.0000 mg | ORAL_TABLET | Freq: Four times a day (QID) | ORAL | Status: DC | PRN
  Administered 2019-10-04: 25 mg via ORAL
  Filled 2019-10-03: qty 1

## 2019-10-03 MED ORDER — TEMAZEPAM 15 MG PO CAPS: 15.0000 mg | ORAL_CAPSULE | Freq: Every evening | ORAL | Status: DC | PRN

## 2019-10-03 MED ORDER — ACETAMINOPHEN 325 MG PO TABS
650.0000 mg | ORAL_TABLET | Freq: Four times a day (QID) | ORAL | Status: DC | PRN
  Filled 2019-10-03: qty 2

## 2019-10-03 MED ORDER — ONDANSETRON 4 MG PO TBDP: 4.0000 mg | ORAL_TABLET | Freq: Four times a day (QID) | ORAL | Status: DC | PRN

## 2019-10-03 MED ORDER — ALUM & MAG HYDROXIDE-SIMETH 200-200-20 MG/5ML PO SUSP: 30.0000 mL | ORAL | Status: DC | PRN

## 2019-10-03 MED ORDER — MAGNESIUM HYDROXIDE 400 MG/5ML PO SUSP: 30.0000 mL | Freq: Every day | ORAL | Status: DC | PRN

## 2019-10-04 DIAGNOSIS — F3162 Bipolar disorder, current episode mixed, moderate: Secondary | ICD-10-CM

## 2019-10-04 MED ORDER — ARIPIPRAZOLE 10 MG PO TABS
10.0000 mg | ORAL_TABLET | Freq: Every day | ORAL | Status: DC
  Administered 2019-10-04: 21:00:00 10 mg via ORAL
  Filled 2019-10-04: qty 1

## 2019-10-04 NOTE — Progress Notes (Signed)
Recreation Therapy Notes  INPATIENT RECREATION THERAPY ASSESSMENT  Patient Details Name: Marissa Andrade MRN: [id number] DOB: Sep 15, 1995 Today's Date: 10/04/2019       Information Obtained From: Patient  Able to Participate in Assessment/Interview: Yes  Patient Presentation: Responsive  Reason for Admission (Per Patient): Active Symptoms  Patient Stressors:    Coping Skills:   Talk, Other (Comment)(Clean)  Leisure Interests (2+):  Community - Careers adviser)  Frequency of Recreation/Participation: Chief Executive Officer of Community Resources:  Yes  Community Resources:  [employer]  Current Use:    If no, Barriers?:    Expressed Interest in [employer] Information:    [place] of Residence:  Arts administrator  Patient Main Form of Transportation: Set designer  Patient Strengths:  Dedicated  Patient Identified Areas of Improvement:  Learn to ask for help  Patient Goal for Hospitalization:  Find medication for my bipolar; while breast feeding  Current SI (including self-harm):  No  Current HI:  No  Current AVH: No  Staff Intervention Plan: Group Attendance, Collaborate with Interdisciplinary Treatment Team  Consent to Intern Participation: N/A  Jevonte Clanton 10/04/2019, 3:32 PM

## 2019-10-04 NOTE — Plan of Care (Signed)
Patient  instructed on [facility] Education , able to verbalize undrstanding  Emotional and mental status improved. Attending programing on unit  voice no concerns around sleep . No anger outburst , able to control behavior . No safety concerns .  No medication  given as yet. Working on [employer] , decision and anxiety  concerns . Encourage patient to verbalize feels  when appropriate . Denies suicidal ideations . Problem: Safety: Goal: Ability to disclose and discuss suicidal ideas will improve Outcome: Progressing Goal: Ability to identify and utilize support systems that promote safety will improve Outcome: Progressing   Problem: Role Relationship: Goal: Will demonstrate positive changes in social behaviors and relationships Outcome: Progressing   Problem: Health Behavior/Discharge Planning: Goal: Ability to make decisions will improve Outcome: Progressing Goal: Compliance with therapeutic regimen will improve Outcome: Progressing   Problem: Coping: Goal: Coping ability will improve Outcome: Progressing Goal: Will verbalize feelings Outcome: Progressing   Problem: Activity: Goal: Interest or engagement in leisure activities will improve Outcome: Progressing Goal: Imbalance in normal sleep/wake cycle will improve Outcome: Progressing   Problem: Safety: Goal: Periods of time without injury will increase Outcome: Progressing   Problem: Activity: Goal: Interest or engagement in activities will improve Outcome: Progressing Goal: Sleeping patterns will improve Outcome: Progressing   Problem: Education: Goal: Knowledge of [facility] Education information/materials will improve Outcome: Progressing Goal: Emotional status will improve Outcome: Progressing Goal: Mental status will improve Outcome: Progressing Goal: Verbalization of understanding the information provided will improve Outcome: Progressing   Problem: Self-Concept: Goal: Will verbalize positive  feelings about self Outcome: Progressing Goal: Level of anxiety will decrease Outcome: Progressing

## 2019-10-04 NOTE — Tx Team (Addendum)
Interdisciplinary Treatment and Diagnostic Plan Update  10/04/2019 Time of Session: 900am Marissa Andrade MRN: [id number]  Principal Diagnosis: <principal problem not specified>  Secondary Diagnoses: Active Problems:   Postpartum depression   Bipolar 1 disorder, mixed, moderate (HCC)   Bipolar 1 disorder (HCC)   Current Medications:  Current Facility-Administered Medications  Medication Dose Route Frequency Provider Last Rate Last Admin  . acetaminophen (TYLENOL) tablet 650 mg  650 mg Oral Q6H PRN Caroline Sauger, NP      . alum & mag hydroxide-simeth (MAALOX/MYLANTA) 200-200-20 MG/5ML suspension 30 mL  30 mL Oral Q4H PRN Caroline Sauger, NP      . hydrOXYzine (ATARAX/VISTARIL) tablet 25 mg  25 mg Oral Q6H PRN Caroline Sauger, NP      . magnesium hydroxide (MILK OF MAGNESIA) suspension 30 mL  30 mL Oral Daily PRN Caroline Sauger, NP      . ondansetron (ZOFRAN-ODT) disintegrating tablet 4 mg  4 mg Oral QID PRN Caroline Sauger, NP      . temazepam (RESTORIL) capsule 15 mg  15 mg Oral QHS PRN Caroline Sauger, NP       PTA Medications: Medications Prior to Admission  Medication Sig Dispense Refill Last Dose  . ibuprofen (ADVIL) 600 MG tablet Take 1 tablet (600 mg total) by mouth every 6 (six) hours as needed. 90 tablet 0   . oxyCODONE-acetaminophen (PERCOCET/ROXICET) 5-325 MG tablet Take 1-2 tablets by mouth every 6 (six) hours as needed for severe pain. 30 tablet 0   . Prenatal Vit-Fe Fumarate-FA (PRENATAL MULTIVITAMIN) TABS tablet Take 1 tablet by mouth daily at 12 noon.       Patient Stressors: Other: post-partum depression  Patient Strengths: Ability for insight Average or above average intelligence Capable of independent living Communication skills General fund of knowledge Motivation for treatment/growth  Treatment Modalities: Medication Management, Group therapy, Case management,  1 to 1 session with clinician, Psychoeducation, Recreational  therapy.   Physician Treatment Plan for Primary Diagnosis: <principal problem not specified> Long Term Goal(s):     Short Term Goals:    Medication Management: Evaluate patient's response, side effects, and tolerance of medication regimen.  Therapeutic Interventions: 1 to 1 sessions, Unit Group sessions and Medication administration.  Evaluation of Outcomes: Not Met  Physician Treatment Plan for Secondary Diagnosis: Active Problems:   Postpartum depression   Bipolar 1 disorder, mixed, moderate (HCC)   Bipolar 1 disorder ([employer])  Long Term Goal(s):     Short Term Goals:       Medication Management: Evaluate patient's response, side effects, and tolerance of medication regimen.  Therapeutic Interventions: 1 to 1 sessions, Unit Group sessions and Medication administration.  Evaluation of Outcomes: Not Met   RN Treatment Plan for Primary Diagnosis: <principal problem not specified> Long Term Goal(s): Knowledge of disease and therapeutic regimen to maintain health will improve  Short Term Goals: Ability to verbalize feelings will improve and Ability to identify and develop effective coping behaviors will improve  Medication Management: RN will administer medications as ordered by provider, will assess and evaluate patient's response and provide education to patient for prescribed medication. RN will report any adverse and/or side effects to prescribing provider.  Therapeutic Interventions: 1 on 1 counseling sessions, Psychoeducation, Medication administration, Evaluate responses to treatment, Monitor vital signs and CBGs as ordered, Perform/monitor CIWA, COWS, AIMS and Fall Risk screenings as ordered, Perform wound care treatments as ordered.  Evaluation of Outcomes: Not Met   LCSW Treatment Plan for Primary Diagnosis: <principal problem not  specified> Long Term Goal(s): Safe transition to appropriate next level of care at discharge, Engage patient in therapeutic group addressing  interpersonal concerns.  Short Term Goals: Engage patient in aftercare planning with referrals and resources and Increase skills for wellness and recovery  Therapeutic Interventions: Assess for all discharge needs, 1 to 1 time with Social worker, Explore available resources and support systems, Assess for adequacy in community support network, Educate family and significant other(s) on suicide prevention, Complete Psychosocial Assessment, Interpersonal group therapy.  Evaluation of Outcomes: Not Met   Progress in Treatment: Attending groups: No. Participating in groups: No. Taking medication as prescribed: Yes. Toleration medication: Yes. Family/Significant other contact made: No, will contact:  pts husband Patient understands diagnosis: Yes. Discussing patient identified problems/goals with staff: Yes. Medical problems stabilized or resolved: Yes. Denies suicidal/homicidal ideation: Yes. Issues/concerns per patient self-inventory: No. Other: N/A  New problem(s) identified: No, Describe:  none  New Short Term/Long Term Goal(s): Detox, elimination of AVH/symptoms of psychosis, medication management for mood stabilization; elimination of SI thoughts; development of comprehensive mental wellness/sobriety plan.   Patient Goals:  "Find Bipolar medications I can take while I'm still breastfeeding"  Discharge Plan or Barriers: SPE pamphlet, Mobile Crisis information, and AA/NA information provided to patient for additional community support and resources. Pt wants to follow up with Dr Humphrey Rolls and [facility].  Reason for Continuation of Hospitalization: Medication stabilization  Estimated Length of Stay: 3-5 days  Recreational Therapy: Patient Stressors: N/A Patient Goal: Patient will engage in groups without prompting or encouragement from LRT x3 group sessions within 5 recreation therapy group sessions  Attendees: Patient: Marissa Andrade 10/04/2019 11:00 AM   Physician: Dr Weber Cooks MD 10/04/2019 11:00 AM  Nursing: Polly Cobia RN 10/04/2019 11:00 AM  RN Care Manager:  10/04/2019 11:00 AM  Social Worker: Minette Brine Moton LCSW 10/04/2019 11:00 AM  Recreational Therapist: Roanna Epley CTRS LRT 10/04/2019 11:00 AM  Other: Sanjuana Kava LCSW  10/04/2019 11:00 AM  Other: Assunta Curtis LCSW 10/04/2019 11:00 AM  Other: 10/04/2019 11:00 AM    Scribe for Treatment Team: Mariann Laster Moton, LCSW 10/04/2019 11:00 AM

## 2019-10-04 NOTE — Progress Notes (Signed)
Recreation Therapy Notes  Date: 10/04/2019  Time: 9:30 am  Location: Craft Room  Behavioral response: Appropriate   Intervention Topic: Communication    Discussion/Intervention:  Group content today was focused on communication. The group defined communication and ways to communicate with others. Individuals stated reason why communication is important and some reasons to communicate with others. Patients expressed if they thought they were good at communicating with others and ways they could improve their communication skills. The group identified important parts of communication and some experiences they have had in the past with communication. The group participated in the intervention "What is that?", where they had a chance to test out their communication skills and identify ways to improve their communication techniques.  Clinical Observations/Feedback:  Patient came to group and stated she likes to communicate by text. She explained that communication is important for understanding. Individual was social with peers and staff while participating in the intervention.   Colby Catanese LRT/CTRS          Zeva Leber 10/04/2019 11:09 AM
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
## 2019-10-04 NOTE — BHH Group Notes (Signed)
B[facility] Group Notes:  (Nursing/MHT/Case Management/Adjunct)  Date:  10/04/2019  Time:  10:06 AM  Type of Therapy:  Community Meeting   Participation Level:  Active  Participation Quality:  Appropriate, Attentive and Sharing  Affect:  Appropriate  Cognitive:  Alert, Appropriate and Oriented  Insight:  Good  Engagement in Group:  Engaged  Modes of Intervention:  Discussion and Orientation  Summary of Progress/Problems:  Marissa Andrade 10/04/2019, 10:06 AM
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
## 2019-10-04 NOTE — [facility] Group Notes (Signed)
[facility] Group Notes:  (Nursing/MHT/Case Management/Adjunct)  Date:  10/04/2019  Time:  3:01 PM  Type of Therapy:  Psychoeducational Skills  Participation Level:  Active  Participation Quality:  Appropriate, Attentive and Sharing  Affect:  Appropriate  Cognitive:  Alert and Appropriate  Insight:  Appropriate  Engagement in Group:  Engaged  Modes of Intervention:  Discussion, Education, Problem-solving and Socialization  Summary of Progress/Problems:  Marissa Andrade A Kiante Ciavarella 10/04/2019, 3:01 PM
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
## 2019-10-04 NOTE — [facility] Group Notes (Signed)
  LCSW Group Therapy Note  10/04/2019 11:46 AM   Type of Therapy/Topic:  Group Therapy:  Feelings about Diagnosis  Participation Level:  Active   Description of Group:   This group will allow patients to explore their thoughts and feelings about diagnoses they have received. Patients will be guided to explore their level of understanding and acceptance of these diagnoses. Facilitator will encourage patients to process their thoughts and feelings about the reactions of others to their diagnosis and will guide patients in identifying ways to discuss their diagnosis with significant others in their lives. This group will be process-oriented, with patients participating in exploration of their own experiences, giving and receiving support, and processing challenge from other group members.   Therapeutic Goals: 1. Patient will demonstrate understanding of diagnosis as evidenced by identifying two or more symptoms of the disorder 2. Patient will be able to express two feelings regarding the diagnosis 3. Patient will demonstrate their ability to communicate their needs through discussion and/or role play  Summary of Patient Progress: Pt was appropriate and respectful in group. Pt was able to identify various diagnoses and discuss associated symptoms with them. Pt reported having some post partum depression after she gave birth last month, but reported that she is doing better. Pt also discussed experiencing mania before.   Therapeutic Modalities:   Cognitive Behavioral Therapy Brief Therapy Feelings Identification    Iris Pert, MSW, LCSW Clinical Social Work 10/04/2019 11:46 AM

## 2019-10-04 NOTE — Progress Notes (Signed)
   10/04/19 1400  Clinical Encounter Type  Visited With Patient;Other (Comment)  Visit Type Initial;Spiritual support;Behavioral Health  Referral From Chaplain  Consult/Referral To Chaplain  Chaplain conducted a group on anxiety and patient attended. Patiently actively participated in the group. Patient was called out of group by doctor, but did return. Chaplain gave patient  A journal. Chaplain offered pastoral presence and empathy.

## 2019-10-04 NOTE — Progress Notes (Signed)
D: Depression    A:  Affect cheerful on approach this am shift ,  Appropriate ADL'S this  Shift and attentive to personal chores. Patient attending unit programing  And is able to identify  And verbalize feelings . Patient attended Treatment Team this am  Able to vent goal  For admission " Medication for breast Feeding , since I'm still breast feeding . " Interacting  With peers on unit . Patient stated slept good last night .Stated appetite is good and energy level normal. Stated concentration good . Stated on Depression scale 3 , hopeless  1 and anxiety 3  .( low 0-10 high) Denies suicidal  homicidal ideations  .  No auditory hallucinations  No pain concerns . Appropriate ADL'S. Interacting with peers and staff.  Pumping breast through out shifts .  Encourage patient participation with unit programming . Instruction  Given on  Medication , verbalize understanding.Patient  instructed on [facility] Education , able to verbalize undrstanding  Emotional and mental status improved. Attending programing on unit  voice no concerns around sleep . No anger outburst , able to control behavior . No safety concerns .  No medication  given as yet. Working on [employer] , decision and anxiety  concerns . Encourage patient to verbalize feels  when appropriate . Denies suicidal ideations .  R: Voice no other concerns. Staff continue to monitor

## 2019-10-04 NOTE — [facility] Suicide Risk Assessment (Signed)
[facility] INPATIENT:  Family/Significant Other Suicide Prevention Education  Suicide Prevention Education:  Contact Attempts:[name], husband [phone]  has been identified by the patient as the family member/significant other with whom the patient will be residing, and identified as the person(s) who will aid the patient in the event of a mental health crisis.  With written consent from the patient, two attempts were made to provide suicide prevention education, prior to and/or following the patient's discharge.  We were unsuccessful in providing suicide prevention education.  A suicide education pamphlet was given to the patient to share with family/significant other.  Date and time of first attempt: 10/04/19 at 11:26AM Date and time of second attempt:  Person who answered the phone stated that he was not Marissa Andrade and CSW had the wrong number.  [name] MSW LCSW 10/04/2019, 11:25 AM

## 2019-10-04 NOTE — Tx Team (Signed)
Initial Treatment Plan 10/04/2019 12:25 AM Marissa Andrade KDP:[id number]    PATIENT STRESSORS: Other: post-partum depression   PATIENT STRENGTHS: Ability for insight Average or above average intelligence Capable of independent living Wellsite geologist fund of knowledge Motivation for treatment/growth   PATIENT IDENTIFIED PROBLEMS:       Suicidal ideation  depression             DISCHARGE CRITERIA:  Ability to meet basic life and health needs Adequate post-discharge living arrangements Improved stabilization in mood, thinking, and/or behavior Medical problems require only outpatient monitoring Motivation to continue treatment in a less acute level of care Need for constant or close observation no longer present Reduction of life-threatening or endangering symptoms to within safe limits Safe-care adequate arrangements made Verbal commitment to aftercare and medication compliance  PRELIMINARY DISCHARGE PLAN: Outpatient therapy Return to previous living arrangement  PATIENT/FAMILY INVOLVEMENT: This treatment plan has been presented to and reviewed with the patient, Marissa Andrade, and/or family member.  The patient and family have been given the opportunity to ask questions and make suggestions.  Billy Coast, RN 10/04/2019, 12:25 AM

## 2019-10-04 NOTE — [facility] Group Notes (Signed)
[facility] Group Notes:  (Nursing/MHT/Case Management/Adjunct)  Date:  10/04/2019  Time:  9:12 PM  Type of Therapy:  Group Therapy  Participation Level:  Active  Participation Quality:  Appropriate  Affect:  Appropriate  Cognitive:  Appropriate  Insight:  Appropriate  Engagement in Group:  Engaged  Modes of Intervention:  Discussion  Summary of Progress/Problems:  Marissa Andrade 10/04/2019, 9:12 PM
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
## 2019-10-05 MED ORDER — HYDROXYZINE HCL 25 MG PO TABS: 25.0000 mg | ORAL_TABLET | Freq: Four times a day (QID) | ORAL | 1 refills | Status: DC | PRN

## 2019-10-05 MED ORDER — ARIPIPRAZOLE 10 MG PO TABS: 10.0000 mg | ORAL_TABLET | Freq: Every day | ORAL | 1 refills | Status: DC

## 2019-10-05 NOTE — Progress Notes (Signed)
Patient was at the nurses station upon arrival to the unit. Patient pleasant during assessment stating to this writer that she was just here to better manage her medicines. Patient given education, support and encouragement to be active in her treatment plan. Patient observed interacting appropriately with staff and peers on the unit. Patient being monitored Q 15 minutes for safety per unit protocol. Patient remains safe on the unit.

## 2019-10-05 NOTE — [facility] Group Notes (Signed)
LCSW Group Therapy Note  10/05/2019 2:02 PM  Type of Therapy/Topic:  Group Therapy:  Emotion Regulation  Participation Level:  Did Not Attend   Description of Group:   The purpose of this group is to assist patients in learning to regulate negative emotions and experience positive emotions. Patients will be guided to discuss ways in which they have been vulnerable to their negative emotions. These vulnerabilities will be juxtaposed with experiences of positive emotions or situations, and patients will be challenged to use positive emotions to combat negative ones. Special emphasis will be placed on coping with negative emotions in conflict situations, and patients will process healthy conflict resolution skills.  Therapeutic Goals: 1. Patient will identify two positive emotions or experiences to reflect on in order to balance out negative emotions 2. Patient will label two or more emotions that they find the most difficult to experience 3. Patient will demonstrate positive conflict resolution skills through discussion and/or role plays  Summary of Patient Progress: X  Therapeutic Modalities:   Cognitive Behavioral Therapy Feelings Identification Dialectical Behavioral Therapy  Besan Ketchem, MSW, LCSW 10/05/2019 2:02 PM  

## 2019-10-05 NOTE — Progress Notes (Signed)
  [facility] Adult Case Management Discharge Plan :  Will you be returning to the same living situation after discharge:  Yes,  yes pt reports that she is returning home.  At discharge, do you have transportation home?: Yes,  pt reports her mother will provider transportation.  Do you have the ability to pay for your medications: Yes,  BCBS  Release of information consent forms completed and in the chart;  Patient's signature needed at discharge.  Patient to Follow up at: Follow-up Information    [facility] on 10/11/2019.   Specialty: Family Medicine Why: You have a medication management appointment with Dr. Welton Flakes on 10/11/19 at 9:40AM. Thank You! Contact information: [address] [place] [address] [phone]           Next level of care provider has access to [facility] Link:no  Safety Planning and Suicide Prevention discussed: Yes,  SPE completed with pt, attempts for SPE with the husband have not been successful.   Have you used any form of tobacco in the last 30 days? (Cigarettes, Smokeless Tobacco, Cigars, and/or Pipes): No  Has patient been referred to the Quitline?: Patient refused referral  Patient has been referred for addiction treatment: Pt. refused referral  Harden Mo, LCSW 10/05/2019, 9:15 AM

## 2019-10-05 NOTE — Plan of Care (Signed)
  Problem: Coping Skills Goal: STG - Patient will identify 3 positive coping skills strategies to use post d/c within 5 recreation therapy group sessions Description: STG - Patient will identify 3 positive coping skills strategies to use post d/c within 5 recreation therapy group sessions 10/05/2019 1533 by Alveria Apley, LRT Outcome: Adequate for Discharge 10/05/2019 1533 by Alveria Apley, LRT Outcome: Adequate for Discharge

## 2019-10-05 NOTE — Progress Notes (Signed)
D: Patient is aware of  Discharge this shift   A:.Patient denies suicidal /homicidal ideations. Patient received all belongings brought in  No Storage medications. Writer reviewed Discharge Summary, Suicide Risk Assessment, and Transitional Record. Patient also received Prescriptions   from  MD. . Aware  Of follow up appointment .  R: Patient left unit with no questions  Or concerns  With family    

## 2019-10-05 NOTE — Progress Notes (Signed)
Recreation Therapy Notes  INPATIENT RECREATION TR PLAN  Patient Details Name: Marissa Andrade MRN: [id number] DOB: 1995/08/20 Today's Date: 10/05/2019  Rec Therapy Plan Is patient appropriate for Therapeutic Recreation?: Yes Treatment times per week: at least 3 Estimated Length of Stay: 5-7 days TR Treatment/Interventions: Group participation (Comment)  Discharge Criteria Pt will be discharged from therapy if:: Discharged Treatment plan/goals/alternatives discussed and agreed upon by:: Patient/family  Discharge Summary Short term goals set: Patient will identify 3 positive coping skills strategies to use post d/c within 5 recreation therapy group sessions Short term goals met: Adequate for discharge Progress toward goals comments: Groups attended Which groups?: Communication Reason goals not met: N/A Therapeutic equipment acquired: N/A Reason patient discharged from therapy: Discharge from hospital Pt/family agrees with progress & goals achieved: Yes Date patient discharged from therapy: 10/05/19   Zita Ozimek 10/05/2019, 3:33 PM

## 2019-10-05 NOTE — Plan of Care (Signed)
  Problem: Education: Goal: Knowledge of [facility] Education information/materials will improve Outcome: Adequate for Discharge Goal: Emotional status will improve Outcome: Adequate for Discharge Goal: Mental status will improve Outcome: Adequate for Discharge Goal: Verbalization of understanding the information provided will improve Outcome: Adequate for Discharge   Problem: Activity: Goal: Interest or engagement in activities will improve Outcome: Adequate for Discharge Goal: Sleeping patterns will improve Outcome: Adequate for Discharge   Problem: Coping: Goal: Ability to verbalize frustrations and anger appropriately will improve Outcome: Adequate for Discharge Goal: Ability to demonstrate self-control will improve Outcome: Adequate for Discharge   Problem: Health Behavior/Discharge Planning: Goal: Identification of resources available to assist in meeting health care needs will improve Outcome: Adequate for Discharge Goal: Compliance with treatment plan for underlying cause of condition will improve Outcome: Adequate for Discharge   Problem: Physical Regulation: Goal: Ability to maintain clinical measurements within normal limits will improve Outcome: Adequate for Discharge   Problem: Safety: Goal: Periods of time without injury will increase Outcome: Adequate for Discharge   Problem: Education: Goal: Utilization of techniques to improve thought processes will improve Outcome: Adequate for Discharge Goal: Knowledge of the prescribed therapeutic regimen will improve Outcome: Adequate for Discharge   Problem: Activity: Goal: Interest or engagement in leisure activities will improve Outcome: Adequate for Discharge Goal: Imbalance in normal sleep/wake cycle will improve Outcome: Adequate for Discharge   Problem: Coping: Goal: Coping ability will improve Outcome: Adequate for Discharge   Problem: Health Behavior/Discharge Planning: Goal: Ability to make  decisions will improve Outcome: Adequate for Discharge Goal: Compliance with therapeutic regimen will improve Outcome: Adequate for Discharge

## 2019-10-05 NOTE — Discharge Summary (Signed)
Physician Discharge Summary Note  Patient:  Marissa Andrade is an 24 y.o., female MRN:  [id number] DOB:  10-08-1995 Patient phone:  [phone] (home)  Patient address:   [id number] [place] [address],  Total Time spent with patient: 30 minutes  Date of Admission:  10/03/2019 Date of Discharge: 10/05/19  Reason for Admission:  This is a 24 year old woman brought to our hospital from [facility] because of concerns about mood disorder.  Patient tells me that she feels like she is coming off of a manic episode.  Principal Problem: Bipolar 1 disorder, mixed, moderate ([facility]) Discharge Diagnoses: Principal Problem:   Bipolar 1 disorder, mixed, moderate ([facility]) Active Problems:   Postpartum depression   Bipolar 1 disorder ([facility])   Past Psychiatric History: Patient reports 1 prior hospitalization when she was 24 years old.  She has been diagnosed with bipolar disorder in the past.  She has had episodes of both mania and depression.  Patient says that with her first child which was about 3 years ago she had a postpartum depression but that this time she feels like she is having more mood fluctuations and mood swings.  She has no history she says of any suicide attempts no history of violence.  Patient does not use drugs or alcohol  Past Medical History:  Past Medical History:  Diagnosis Date  . Blood transfusion without reported diagnosis   . Depression    PPD  . Gestational diabetes   . History of gestational hypertension   . Tachycardia 07/05/2017   postpartum, spent 1 week in hospital, no further diagnosis    Past Surgical History:  Procedure Laterality Date  . BREAST SURGERY    . CESAREAN SECTION    . CESAREAN SECTION N/A 08/22/2019   Procedure: CESAREAN SECTION;  Surgeon: Philip Aspen, DO;  Location: [facility] LD ORS;  Service: Obstetrics;  Laterality: N/A;   Family History:  Family History  Problem Relation Age of Onset  . Hypertension Other    Family Psychiatric   History: Patient reports positive family history of anxiety but no history of suicide Social History:  Social History   Substance and Sexual Activity  Alcohol Use None     Social History   Substance and Sexual Activity  Drug Use Not on file    Social History   Socioeconomic History  . Marital status: Married    Spouse name: Not on file  . Number of children: Not on file  . Years of education: Not on file  . Highest education level: Not on file  Occupational History  . Not on file  Tobacco Use  . Smoking status: Never Smoker  . Smokeless tobacco: Never Used  Substance and Sexual Activity  . Alcohol use: Not on file  . Drug use: Not on file  . Sexual activity: Not on file  Other Topics Concern  . Not on file  Social History Narrative  . Not on file   Social Determinants of Health   Financial Resource Strain:   . Difficulty of Paying Living Expenses: Not on file  Food Insecurity:   . Worried About Programme researcher, broadcasting/film/video in the Last Year: Not on file  . Ran Out of Food in the Last Year: Not on file  Transportation Needs:   . Lack of Transportation (Medical): Not on file  . Lack of Transportation (Non-Medical): Not on file  Physical Activity:   . Days of Exercise per Week: Not on file  . Minutes of  Exercise per Session: Not on file  Stress:   . Feeling of Stress : Not on file  Social Connections:   . Frequency of Communication with Friends and Family: Not on file  . Frequency of Social Gatherings with Friends and Family: Not on file  . Attends Religious Services: Not on file  . Active Member of Clubs or Organizations: Not on file  . Attends Banker Meetings: Not on file  . Marital Status: Not on file    Hospital Course:  Patient remained on the [facility] unit for 1 days. The patient stabilized on medication and therapy. Patient was discharged on Abilify 10 mg Daily and Vistaril 25 mg TID PRN. Patient has shown improvement with improved mood, affect, sleep,  appetite, and interaction. Patient has attended group and participated. Patient has been seen in the day room interacting with peers and staff appropriately. Patient denies any SI/HI/AVH and contracts for safety. Patient agrees to follow up at [facility]. Patient is provided with prescriptions for their medications upon discharge.  Physical Findings: AIMS: Facial and Oral Movements Muscles of Facial Expression: None, normal Lips and Perioral Area: None, normal Jaw: None, normal Tongue: None, normal,Extremity Movements Upper (arms, wrists, hands, fingers): None, normal Lower (legs, knees, ankles, toes): None, normal, Trunk Movements Neck, shoulders, hips: None, normal, Overall Severity Severity of abnormal movements (highest score from questions above): None, normal Incapacitation due to abnormal movements: None, normal Patient's awareness of abnormal movements (rate only patient's report): No Awareness, Dental Status Current problems with teeth and/or dentures?: No Does patient usually wear dentures?: No  CIWA:    COWS:     Musculoskeletal: Strength & Muscle Tone: within normal limits Gait & Station: normal Patient leans: N/A  Psychiatric Specialty Exam: Physical Exam  Nursing note and vitals reviewed. Constitutional: She is oriented to person, place, and time. She appears well-developed and well-nourished.  Respiratory: Effort normal.  Musculoskeletal:        General: Normal range of motion.  Neurological: She is alert and oriented to person, place, and time.  Skin: Skin is warm.    Review of Systems  Constitutional: Negative.   HENT: Negative.   Eyes: Negative.   Respiratory: Negative.   Cardiovascular: Negative.   Gastrointestinal: Negative.   Genitourinary: Negative.   Musculoskeletal: Negative.   Skin: Negative.   Neurological: Negative.   Psychiatric/Behavioral: Negative.     Blood pressure 124/88, pulse (!) 111, temperature 98.1 F (36.7 C),  temperature source Oral, resp. rate 18, height 5\' 4"  (1.626 m), weight 96.2 kg, SpO2 98 %, unknown if currently breastfeeding.Body mass index is 36.39 kg/m.   General Appearance: Casual  Eye Contact::  Good  Speech:  Clear and Coherent409  Volume:  Normal  Mood:  Euthymic  Affect:  Congruent  Thought Process:  Goal Directed  Orientation:  Full (Time, Place, and Person)  Thought Content:  Logical  Suicidal Thoughts:  No  Homicidal Thoughts:  No  Memory:  Immediate;   Fair Recent;   Fair Remote;   Fair  Judgement:  Fair  Insight:  Fair  Psychomotor Activity:  Normal  Concentration:  Fair  Recall:  [phone] of Knowledge:Fair  Language: Fair  Akathisia:  No  Handed:  Right  AIMS (if indicated):     Assets:  Desire for Improvement Financial Resources/Insurance Housing Physical Health Resilience Social Support  Sleep:  Number of Hours: 6.5  Cognition: WNL  ADL's:  Intact   Have you used any  form of tobacco in the last 30 days? (Cigarettes, Smokeless Tobacco, Cigars, and/or Pipes): No  Has this patient used any form of tobacco in the last 30 days? (Cigarettes, Smokeless Tobacco, Cigars, and/or Pipes) Yes, No  Blood Alcohol level:  No results found for: [facility]  Metabolic Disorder Labs:  No results found for: HGBA1C, MPG No results found for: PROLACTIN Lab Results  Component Value Date   TRIG 262 (H) 06/08/2019    See Psychiatric Specialty Exam and Suicide Risk Assessment completed by Attending Physician prior to discharge.  Discharge destination:  Home  Is patient on multiple antipsychotic therapies at discharge:  No   Has Patient had three or more failed trials of antipsychotic monotherapy by history:  No  Recommended Plan for Multiple Antipsychotic Therapies: NA  Discharge Instructions    Diet - low sodium heart healthy   Complete by: As directed    Increase activity slowly   Complete by: As directed      Allergies as of 10/05/2019   No Known Allergies      Medication List    STOP taking these medications   ibuprofen 600 MG tablet Commonly known as: ADVIL   oxyCODONE-acetaminophen 5-325 MG tablet Commonly known as: PERCOCET/ROXICET   prenatal multivitamin Tabs tablet     TAKE these medications     Indication  ARIPiprazole 10 MG tablet Commonly known as: ABILIFY Take 1 tablet (10 mg total) by mouth at bedtime.  Indication: Manic Phase of Manic-Depression   hydrOXYzine 25 MG tablet Commonly known as: ATARAX/VISTARIL Take 1 tablet (25 mg total) by mouth every 6 (six) hours as needed for anxiety.  Indication: Feeling Anxious      Follow-up Information    [facility] up on 10/11/2019.   Specialty: Family Medicine Why: You have a medication management appointment with Dr. Humphrey Rolls on 10/11/19 at 9:40AM. Thank You! Contact information: [place] [address] [phone]           Follow-up recommendations:  Continue activity as tolerated. Continue diet as recommended by your PCP. Ensure to keep all appointments with outpatient providers.  Comments:  Patient is instructed prior to discharge to: Take all medications as prescribed by his/her mental healthcare provider. Report any adverse effects and or reactions from the medicines to his/her outpatient provider promptly. Patient has been instructed & cautioned: To not engage in alcohol and or illegal drug use while on prescription medicines. In the event of worsening symptoms, patient is instructed to call the crisis hotline, 911 and or go to the nearest ED for appropriate evaluation and treatment of symptoms. To follow-up with his/her primary care provider for your other medical issues, concerns and or health care needs.    Signed: Lowry Ram Peighton Mehra, FNP 10/05/2019, 9:18 AM

## 2019-10-05 NOTE — [facility] Group Notes (Signed)
[facility] Group Notes:  (Nursing/MHT/Case Management/Adjunct)  Date:  10/05/2019  Time:  8:53 AM  Type of Therapy:  Community Meeting   Participation Level:  Active  Participation Quality:  Appropriate and Sharing  Affect:  Appropriate  Cognitive:  Alert and Appropriate  Insight:  Appropriate and Good  Engagement in Group:  Engaged and Supportive  Modes of Intervention:  Discussion and Orientation  Summary of Progress/Problems:  Marissa Andrade 10/05/2019, 8:53 AM

## 2019-10-05 NOTE — [facility]HH Suicide Risk Assessment (Signed)
[facility] Discharge Suicide Risk Assessment   Principal Problem: Bipolar 1 disorder, mixed, moderate (HCC) Discharge Diagnoses: Principal Problem:   Bipolar 1 disorder, mixed, moderate (HCC) Active Problems:   Postpartum depression   Bipolar 1 disorder (HCC)   Total Time spent with patient: 30 minutes  Musculoskeletal: Strength & Muscle Tone: within normal limits Gait & Station: normal Patient leans: N/A  Psychiatric Specialty Exam: Review of Systems  Constitutional: Negative.   HENT: Negative.   Eyes: Negative.   Respiratory: Negative.   Cardiovascular: Negative.   Gastrointestinal: Negative.   Musculoskeletal: Negative.   Skin: Negative.   Neurological: Negative.   Psychiatric/Behavioral: Negative.     Blood pressure 124/88, pulse (!) 111, temperature 98.1 F (36.7 C), temperature source Oral, resp. rate 18, height 5\' 4"  (1.626 m), weight 96.2 kg, SpO2 98 %, unknown if currently breastfeeding.Body mass index is 36.39 kg/m.  General Appearance: Casual  Eye Contact::  Good  Speech:  Clear and Coherent409  Volume:  Normal  Mood:  Euthymic  Affect:  Congruent  Thought Process:  Goal Directed  Orientation:  Full (Time, Place, and Person)  Thought Content:  Logical  Suicidal Thoughts:  No  Homicidal Thoughts:  No  Memory:  Immediate;   Fair Recent;   Fair Remote;   Fair  Judgement:  Fair  Insight:  Fair  Psychomotor Activity:  Normal  Concentration:  Fair  Recall:  [id number] of Knowledge:Fair  Language: Fair  Akathisia:  No  Handed:  Right  AIMS (if indicated):     Assets:  Desire for Improvement Financial Resources/Insurance Housing Physical Health Resilience Social Support  Sleep:  Number of Hours: 6.5  Cognition: WNL  ADL's:  Intact   Mental Status Per Nursing Assessment::   On Admission:  Self-harm thoughts, Suicidal ideation indicated by others, Suicidal ideation indicated by patient  Demographic Factors:  Adolescent or young adult  Loss  Factors: NA  Historical Factors: NA  Risk Reduction Factors:   Responsible for children under 59 years of age, Sense of responsibility to family, Religious beliefs about death, Living with another person, especially a relative, Positive social support and Positive therapeutic relationship  Continued Clinical Symptoms:  Bipolar Disorder:   Mixed State  Cognitive Features That Contribute To Risk:  None    Suicide Risk:  Minimal: No identifiable suicidal ideation.  Patients presenting with no risk factors but with morbid ruminations; may be classified as minimal risk based on the severity of the depressive symptoms  Follow-up Information    [facility] on 10/11/2019.   Specialty: Family Medicine Why: You have a medication management appointment with Dr. 12/11/2019 on 10/11/19 at 9:40AM. Thank You! Contact information: [address] [place] [phone]           Plan Of Care/Follow-up recommendations:  Activity:  Activity as tolerated Diet:  Regular diet Other:  Follow-up with outpatient treatment starting with your family practice provider back in [place].  [place], MD 10/05/2019, 9:02 AM

## 2019-10-05 NOTE — Progress Notes (Signed)
Recreation Therapy Notes   Date: 10/05/2019  Time: 9:30 am   Location: Craft room   Behavioral response: N/A   Intervention Topic: Self-esteem   Discussion/Intervention: Patient did not attend group.   Clinical Observations/Feedback:  Patient did not attend group.   Najmah Carradine LRT/CTRS        [place] Dowd 10/05/2019 12:35 PM

## 2019-10-05 NOTE — Plan of Care (Signed)
Patient stated she was feeling better because she is here getting her medications right   Problem: Education: Goal: Emotional status will improve Outcome: Progressing Goal: Mental status will improve Outcome: Progressing

## 2019-10-11 DIAGNOSIS — Z6839 Body mass index (BMI) 39.0-39.9, adult: Secondary | ICD-10-CM | POA: Diagnosis not present

## 2019-10-11 DIAGNOSIS — F319 Bipolar disorder, unspecified: Secondary | ICD-10-CM | POA: Diagnosis not present

## 2019-10-11 DIAGNOSIS — O906 Postpartum mood disturbance: Secondary | ICD-10-CM | POA: Diagnosis not present

## 2019-10-18 DIAGNOSIS — F319 Bipolar disorder, unspecified: Secondary | ICD-10-CM | POA: Diagnosis not present

## 2019-10-18 DIAGNOSIS — Z6839 Body mass index (BMI) 39.0-39.9, adult: Secondary | ICD-10-CM | POA: Diagnosis not present

## 2019-12-02 DIAGNOSIS — O906 Postpartum mood disturbance: Secondary | ICD-10-CM | POA: Diagnosis not present

## 2019-12-02 DIAGNOSIS — F3178 Bipolar disorder, in full remission, most recent episode mixed: Secondary | ICD-10-CM | POA: Diagnosis not present

## 2020-01-11 DIAGNOSIS — Z20822 Contact with and (suspected) exposure to covid-19: Secondary | ICD-10-CM | POA: Diagnosis not present

## 2020-01-11 DIAGNOSIS — R45851 Suicidal ideations: Secondary | ICD-10-CM | POA: Diagnosis not present

## 2020-01-11 DIAGNOSIS — F419 Anxiety disorder, unspecified: Secondary | ICD-10-CM | POA: Diagnosis not present

## 2020-01-11 DIAGNOSIS — F3130 Bipolar disorder, current episode depressed, mild or moderate severity, unspecified: Secondary | ICD-10-CM | POA: Diagnosis not present

## 2020-01-11 DIAGNOSIS — Z915 Personal history of self-harm: Secondary | ICD-10-CM | POA: Diagnosis not present

## 2020-01-11 DIAGNOSIS — E669 Obesity, unspecified: Secondary | ICD-10-CM | POA: Diagnosis not present

## 2020-01-11 DIAGNOSIS — R519 Headache, unspecified: Secondary | ICD-10-CM | POA: Diagnosis not present

## 2020-01-11 DIAGNOSIS — F53 Postpartum depression: Secondary | ICD-10-CM | POA: Diagnosis not present

## 2020-01-11 DIAGNOSIS — Z6838 Body mass index (BMI) 38.0-38.9, adult: Secondary | ICD-10-CM | POA: Diagnosis not present

## 2020-01-11 DIAGNOSIS — F319 Bipolar disorder, unspecified: Secondary | ICD-10-CM | POA: Diagnosis not present

## 2020-01-11 DIAGNOSIS — G8929 Other chronic pain: Secondary | ICD-10-CM | POA: Diagnosis not present

## 2020-01-11 DIAGNOSIS — O99345 Other mental disorders complicating the puerperium: Secondary | ICD-10-CM | POA: Diagnosis not present

## 2020-01-11 DIAGNOSIS — Z79899 Other long term (current) drug therapy: Secondary | ICD-10-CM | POA: Diagnosis not present

## 2020-01-19 DIAGNOSIS — F53 Postpartum depression: Secondary | ICD-10-CM | POA: Diagnosis not present

## 2020-01-19 DIAGNOSIS — Z79899 Other long term (current) drug therapy: Secondary | ICD-10-CM | POA: Diagnosis not present

## 2020-01-20 DIAGNOSIS — O906 Postpartum mood disturbance: Secondary | ICD-10-CM | POA: Diagnosis not present

## 2020-01-20 DIAGNOSIS — F3178 Bipolar disorder, in full remission, most recent episode mixed: Secondary | ICD-10-CM | POA: Diagnosis not present

## 2020-02-20 DIAGNOSIS — F3178 Bipolar disorder, in full remission, most recent episode mixed: Secondary | ICD-10-CM | POA: Diagnosis not present

## 2020-02-20 DIAGNOSIS — O906 Postpartum mood disturbance: Secondary | ICD-10-CM | POA: Diagnosis not present

## 2020-03-09 DIAGNOSIS — O906 Postpartum mood disturbance: Secondary | ICD-10-CM | POA: Diagnosis not present

## 2020-03-09 DIAGNOSIS — F3132 Bipolar disorder, current episode depressed, moderate: Secondary | ICD-10-CM | POA: Diagnosis not present

## 2020-04-27 DIAGNOSIS — O906 Postpartum mood disturbance: Secondary | ICD-10-CM | POA: Diagnosis not present

## 2020-04-27 DIAGNOSIS — F3132 Bipolar disorder, current episode depressed, moderate: Secondary | ICD-10-CM | POA: Diagnosis not present

## 2020-08-14 DIAGNOSIS — K3580 Unspecified acute appendicitis: Secondary | ICD-10-CM | POA: Diagnosis not present

## 2020-08-14 DIAGNOSIS — R1031 Right lower quadrant pain: Secondary | ICD-10-CM | POA: Diagnosis not present

## 2020-08-14 DIAGNOSIS — E669 Obesity, unspecified: Secondary | ICD-10-CM | POA: Diagnosis not present

## 2020-08-14 DIAGNOSIS — J45909 Unspecified asthma, uncomplicated: Secondary | ICD-10-CM | POA: Diagnosis not present

## 2020-08-14 DIAGNOSIS — Z79899 Other long term (current) drug therapy: Secondary | ICD-10-CM | POA: Diagnosis not present

## 2020-08-14 DIAGNOSIS — F32A Depression, unspecified: Secondary | ICD-10-CM | POA: Diagnosis not present

## 2020-08-14 DIAGNOSIS — F419 Anxiety disorder, unspecified: Secondary | ICD-10-CM | POA: Diagnosis not present

## 2020-08-22 DIAGNOSIS — Z9049 Acquired absence of other specified parts of digestive tract: Secondary | ICD-10-CM | POA: Diagnosis not present

## 2020-08-22 DIAGNOSIS — R0600 Dyspnea, unspecified: Secondary | ICD-10-CM | POA: Diagnosis not present

## 2020-08-22 DIAGNOSIS — Z6841 Body Mass Index (BMI) 40.0 and over, adult: Secondary | ICD-10-CM | POA: Diagnosis not present

## 2020-08-22 DIAGNOSIS — F319 Bipolar disorder, unspecified: Secondary | ICD-10-CM | POA: Diagnosis not present

## 2020-10-29 DIAGNOSIS — Z6841 Body Mass Index (BMI) 40.0 and over, adult: Secondary | ICD-10-CM | POA: Diagnosis not present

## 2020-10-29 DIAGNOSIS — Z30011 Encounter for initial prescription of contraceptive pills: Secondary | ICD-10-CM | POA: Diagnosis not present

## 2020-10-29 DIAGNOSIS — Z021 Encounter for pre-employment examination: Secondary | ICD-10-CM | POA: Diagnosis not present

## 2020-11-01 DIAGNOSIS — F4310 Post-traumatic stress disorder, unspecified: Secondary | ICD-10-CM | POA: Diagnosis not present

## 2020-11-01 DIAGNOSIS — F411 Generalized anxiety disorder: Secondary | ICD-10-CM | POA: Diagnosis not present

## 2020-11-01 DIAGNOSIS — F3181 Bipolar II disorder: Secondary | ICD-10-CM | POA: Diagnosis not present

## 2020-11-01 DIAGNOSIS — F429 Obsessive-compulsive disorder, unspecified: Secondary | ICD-10-CM | POA: Diagnosis not present

## 2020-11-06 DIAGNOSIS — F429 Obsessive-compulsive disorder, unspecified: Secondary | ICD-10-CM | POA: Diagnosis not present

## 2020-11-06 DIAGNOSIS — F4010 Social phobia, unspecified: Secondary | ICD-10-CM | POA: Diagnosis not present

## 2020-11-06 DIAGNOSIS — F3181 Bipolar II disorder: Secondary | ICD-10-CM | POA: Diagnosis not present

## 2020-11-06 DIAGNOSIS — F4310 Post-traumatic stress disorder, unspecified: Secondary | ICD-10-CM | POA: Diagnosis not present

## 2020-11-21 DIAGNOSIS — F4010 Social phobia, unspecified: Secondary | ICD-10-CM | POA: Diagnosis not present

## 2020-11-21 DIAGNOSIS — F4310 Post-traumatic stress disorder, unspecified: Secondary | ICD-10-CM | POA: Diagnosis not present

## 2020-11-21 DIAGNOSIS — F3181 Bipolar II disorder: Secondary | ICD-10-CM | POA: Diagnosis not present

## 2021-01-27 DIAGNOSIS — J029 Acute pharyngitis, unspecified: Secondary | ICD-10-CM | POA: Diagnosis not present

## 2021-02-04 DIAGNOSIS — M546 Pain in thoracic spine: Secondary | ICD-10-CM | POA: Diagnosis not present

## 2021-02-04 DIAGNOSIS — N3000 Acute cystitis without hematuria: Secondary | ICD-10-CM | POA: Diagnosis not present

## 2021-02-07 DIAGNOSIS — R079 Chest pain, unspecified: Secondary | ICD-10-CM | POA: Diagnosis not present

## 2021-02-07 DIAGNOSIS — R6510 Systemic inflammatory response syndrome (SIRS) of non-infectious origin without acute organ dysfunction: Secondary | ICD-10-CM | POA: Diagnosis not present

## 2021-02-07 DIAGNOSIS — R109 Unspecified abdominal pain: Secondary | ICD-10-CM | POA: Diagnosis not present

## 2021-02-07 DIAGNOSIS — Z48815 Encounter for surgical aftercare following surgery on the digestive system: Secondary | ICD-10-CM | POA: Diagnosis not present

## 2021-02-07 DIAGNOSIS — K219 Gastro-esophageal reflux disease without esophagitis: Secondary | ICD-10-CM | POA: Diagnosis not present

## 2021-02-07 DIAGNOSIS — K851 Biliary acute pancreatitis without necrosis or infection: Secondary | ICD-10-CM | POA: Diagnosis not present

## 2021-02-07 DIAGNOSIS — K859 Acute pancreatitis without necrosis or infection, unspecified: Secondary | ICD-10-CM | POA: Diagnosis not present

## 2021-02-07 DIAGNOSIS — K8010 Calculus of gallbladder with chronic cholecystitis without obstruction: Secondary | ICD-10-CM | POA: Diagnosis not present

## 2021-02-07 DIAGNOSIS — K8020 Calculus of gallbladder without cholecystitis without obstruction: Secondary | ICD-10-CM | POA: Diagnosis not present

## 2021-02-07 DIAGNOSIS — K811 Chronic cholecystitis: Secondary | ICD-10-CM | POA: Diagnosis not present

## 2021-02-13 DIAGNOSIS — K811 Chronic cholecystitis: Secondary | ICD-10-CM | POA: Diagnosis not present

## 2021-02-20 DIAGNOSIS — Z6841 Body Mass Index (BMI) 40.0 and over, adult: Secondary | ICD-10-CM | POA: Diagnosis not present

## 2021-02-20 DIAGNOSIS — K851 Biliary acute pancreatitis without necrosis or infection: Secondary | ICD-10-CM | POA: Diagnosis not present

## 2021-02-27 DIAGNOSIS — R109 Unspecified abdominal pain: Secondary | ICD-10-CM | POA: Diagnosis not present

## 2021-02-28 DIAGNOSIS — Z6841 Body Mass Index (BMI) 40.0 and over, adult: Secondary | ICD-10-CM | POA: Diagnosis not present

## 2021-02-28 DIAGNOSIS — K851 Biliary acute pancreatitis without necrosis or infection: Secondary | ICD-10-CM | POA: Diagnosis not present

## 2021-02-28 DIAGNOSIS — R1013 Epigastric pain: Secondary | ICD-10-CM | POA: Diagnosis not present

## 2021-03-08 ENCOUNTER — Other Ambulatory Visit: Payer: Self-pay

## 2021-03-08 ENCOUNTER — Encounter (HOSPITAL_COMMUNITY): Payer: Self-pay

## 2021-03-08 ENCOUNTER — Emergency Department (HOSPITAL_COMMUNITY)
Admission: EM | Admit: 2021-03-08 | Discharge: 2021-03-08 | Disposition: A | Discharge status: Home / Self Care | Payer: BC Managed Care – PPO | Attending: Emergency Medicine | Admitting: Emergency Medicine

## 2021-03-08 ENCOUNTER — Emergency Department (HOSPITAL_COMMUNITY): Payer: BC Managed Care – PPO

## 2021-03-08 DIAGNOSIS — Z8616 Personal history of COVID-19: Secondary | ICD-10-CM | POA: Insufficient documentation

## 2021-03-08 DIAGNOSIS — R1010 Upper abdominal pain, unspecified: Secondary | ICD-10-CM

## 2021-03-08 DIAGNOSIS — R112 Nausea with vomiting, unspecified: Secondary | ICD-10-CM | POA: Diagnosis not present

## 2021-03-08 DIAGNOSIS — M549 Dorsalgia, unspecified: Secondary | ICD-10-CM | POA: Diagnosis not present

## 2021-03-08 DIAGNOSIS — R197 Diarrhea, unspecified: Secondary | ICD-10-CM | POA: Diagnosis not present

## 2021-03-08 DIAGNOSIS — R1110 Vomiting, unspecified: Secondary | ICD-10-CM | POA: Diagnosis not present

## 2021-03-08 DIAGNOSIS — Z8719 Personal history of other diseases of the digestive system: Secondary | ICD-10-CM | POA: Diagnosis not present

## 2021-03-08 DIAGNOSIS — R1013 Epigastric pain: Secondary | ICD-10-CM | POA: Insufficient documentation

## 2021-03-08 DIAGNOSIS — R000 Tachycardia, unspecified: Secondary | ICD-10-CM | POA: Diagnosis not present

## 2021-03-08 DIAGNOSIS — Z9049 Acquired absence of other specified parts of digestive tract: Secondary | ICD-10-CM | POA: Diagnosis not present

## 2021-03-08 LAB — URINALYSIS, ROUTINE W REFLEX MICROSCOPIC
Bilirubin Urine: NEGATIVE
Glucose, UA: NEGATIVE mg/dL
Hgb urine dipstick: NEGATIVE
Ketones, ur: NEGATIVE mg/dL
Leukocytes,Ua: NEGATIVE
Nitrite: NEGATIVE
Protein, ur: NEGATIVE mg/dL
Specific Gravity, Urine: 1.014 (ref 1.005–1.030)
pH: 6 (ref 5.0–8.0)

## 2021-03-08 LAB — COMPREHENSIVE METABOLIC PANEL
ALT: 18 U/L (ref 0–44)
AST: 14 U/L — ABNORMAL LOW (ref 15–41)
Albumin: 3.9 g/dL (ref 3.5–5.0)
Alkaline Phosphatase: 50 U/L (ref 38–126)
Anion gap: 10 (ref 5–15)
BUN: 12 mg/dL (ref 6–20)
CO2: 24 mmol/L (ref 22–32)
Calcium: 9.1 mg/dL (ref 8.9–10.3)
Chloride: 103 mmol/L (ref 98–111)
Creatinine, Ser: 0.85 mg/dL (ref 0.44–1.00)
GFR, Estimated: >60 mL/min (ref >60)
Glucose, Bld: 92 mg/dL (ref 70–99)
Potassium: 3.9 mmol/L (ref 3.5–5.1)
Sodium: 137 mmol/L (ref 135–145)
Total Bilirubin: 1 mg/dL (ref 0.3–1.2)
Total Protein: 7.2 g/dL (ref 6.5–8.1)

## 2021-03-08 LAB — CBC WITH DIFFERENTIAL/PLATELET
Abs Immature Granulocytes: 0.02 10*3/uL (ref 0.00–0.07)
Basophils Absolute: 0 10*3/uL (ref 0.0–0.1)
Basophils Relative: 0 %
Eosinophils Absolute: 0.1 10*3/uL (ref 0.0–0.5)
Eosinophils Relative: 2 %
HCT: 41.3 % (ref 36.0–46.0)
Hemoglobin: 14.1 g/dL (ref 12.0–15.0)
Immature Granulocytes: 0 %
Lymphocytes Relative: 34 %
Lymphs Abs: 2 10*3/uL (ref 0.7–4.0)
MCH: 30.4 pg (ref 26.0–34.0)
MCHC: 34.1 g/dL (ref 30.0–36.0)
MCV: 89 fL (ref 80.0–100.0)
Monocytes Absolute: 0.5 10*3/uL (ref 0.1–1.0)
Monocytes Relative: 8 %
Neutro Abs: 3.2 10*3/uL (ref 1.7–7.7)
Neutrophils Relative %: 56 %
Platelets: 197 10*3/uL (ref 150–400)
RBC: 4.64 MIL/uL (ref 3.87–5.11)
RDW: 12.5 % (ref 11.5–15.5)
WBC: 5.8 10*3/uL (ref 4.0–10.5)
nRBC: 0 % (ref 0.0–0.2)

## 2021-03-08 LAB — I-STAT BETA HCG BLOOD, ED (MC, WL, AP ONLY): I-stat hCG, quantitative: <5 m[IU]/mL (ref <5)

## 2021-03-08 LAB — LIPASE, BLOOD: Lipase: 28 U/L (ref 11–51)

## 2021-03-08 MED ORDER — HALOPERIDOL LACTATE 5 MG/ML IJ SOLN
2.0000 mg | Freq: Once | INTRAMUSCULAR | Status: AC
  Administered 2021-03-08: 2 mg via INTRAVENOUS
  Filled 2021-03-08: qty 1

## 2021-03-08 MED ORDER — IOHEXOL 350 MG/ML SOLN
100.0000 mL | Freq: Once | INTRAVENOUS | Status: AC | PRN
  Administered 2021-03-08: 100 mL via INTRAVENOUS

## 2021-03-08 MED ORDER — ONDANSETRON 4 MG PO TBDP
4.0000 mg | ORAL_TABLET | Freq: Once | ORAL | Status: AC
  Administered 2021-03-08: 4 mg via ORAL
  Filled 2021-03-08: qty 1

## 2021-03-08 MED ORDER — PANTOPRAZOLE SODIUM 40 MG PO TBEC
40.0000 mg | DELAYED_RELEASE_TABLET | Freq: Once | ORAL | Status: AC
  Administered 2021-03-08: 40 mg via ORAL
  Filled 2021-03-08: qty 1

## 2021-03-08 MED ORDER — LIDOCAINE VISCOUS HCL 2 % MT SOLN
15.0000 mL | Freq: Once | OROMUCOSAL | Status: AC
  Administered 2021-03-08: 15 mL via ORAL
  Filled 2021-03-08: qty 15

## 2021-03-08 MED ORDER — SUCRALFATE 1 GM/10ML PO SUSP: 1.0000 g | Freq: Three times a day (TID) | ORAL | 0 refills | Status: AC

## 2021-03-08 MED ORDER — ALUM & MAG HYDROXIDE-SIMETH 200-200-20 MG/5ML PO SUSP
30.0000 mL | Freq: Once | ORAL | Status: AC
  Administered 2021-03-08: 30 mL via ORAL
  Filled 2021-03-08: qty 30

## 2021-03-08 NOTE — ED Provider Notes (Signed)
[facility] EMERGENCY DEPARTMENT Provider Note   CSN: [id number] Arrival date & time: 03/08/21  1156     History Chief Complaint  Patient presents with   Abdominal Pain   Back Pain    Marissa Andrade is a 25 y.o. female presenting for evaluation of epigastric pain.  Patient states she was diagnosed with pancreatitis on February 10, 2013 days ago.  After being hospitalized, her gallbladder became infected and she had a cholecystectomy.  About a week and a half ago, she started developing worsening postprandial epigastric pain.  She states her pain is similar to when she had pancreatitis, but less severe.  Pain radiates to her back.  Is most severe for 1 to 2 hours after eating, but now is constant.  She has associated nausea and vomiting, which has been present since her surgery.  No fevers or chills.  No chest pain or shortness of breath.  No urinary symptoms.  She has had diarrhea since the surgery.  She has been taking Zofran and Protonix without improvement of symptoms. Pt's sugery was at [facility].    HPI     Past Medical History:  Diagnosis Date   Blood transfusion without reported diagnosis    Depression    PPD   Gestational diabetes    History of gestational hypertension    Tachycardia 07/05/2017   postpartum, spent 1 week in hospital, no further diagnosis    Patient Active Problem List   Diagnosis Date Noted   Postpartum depression 10/03/2019   Bipolar 1 disorder, mixed, moderate (HCC) 10/03/2019   Bipolar 1 disorder (HCC) 10/03/2019   Cesarean delivery delivered 08/22/2019   Encounter for maternal care for low transverse scar from repeat cesarean delivery 08/22/2019   Pneumonia due to COVID-19 virus 06/08/2019   Pregnancy and infectious disease in third trimester 06/08/2019   History of cesarean section 01/07/2019   History of premature delivery 01/07/2019   Increased body mass index 01/07/2019   Anemia 07/07/2017    Past Surgical History:   Procedure Laterality Date   BREAST SURGERY     CESAREAN SECTION     CESAREAN SECTION N/A 08/22/2019   Procedure: CESAREAN SECTION;  Surgeon: Philip Aspen, DO;  Location: [facility] LD ORS;  Service: Obstetrics;  Laterality: N/A;   CHOLECYSTECTOMY       OB History     Gravida  2   Para  2   Term  1   Preterm  1   AB  0   Living  2      SAB  0   IAB  0   Ectopic  0   Multiple  0   Live Births  2           Family History  Problem Relation Age of Onset   Hypertension Other     Social History   Tobacco Use   Smoking status: Never   Smokeless tobacco: Never    Home Medications Prior to Admission medications   Medication Sig Start Date End Date Taking? Authorizing Provider  sucralfate (CARAFATE) 1 GM/10ML suspension Take 10 mLs (1 g total) by mouth 4 (four) times daily -  with meals and at bedtime. 03/08/21  Yes Harlyn Italiano, PA-C  ARIPiprazole (ABILIFY) 10 MG tablet Take 1 tablet (10 mg total) by mouth at bedtime. 10/05/19   Money, Gerlene Burdock, FNP  hydrOXYzine (ATARAX/VISTARIL) 25 MG tablet Take 1 tablet (25 mg total) by mouth every 6 (six) hours as needed  for anxiety. 10/05/19   Money, Gerlene Burdockravis B, FNP    Allergies    Patient has no known allergies.  Review of Systems   Review of Systems  Gastrointestinal:  Positive for abdominal pain, diarrhea, nausea and vomiting.  Skin:  Negative for wound.  Neurological:  Negative for numbness.   Physical Exam Updated Vital Signs BP 96/82 (BP Location: Left Arm)   Pulse 97   Temp 98.2 F (36.8 C) (Oral)   Resp 18   Ht 5\' 4"  (1.626 m)   Wt 99.8 kg   LMP 02/09/2021   SpO2 98%   BMI 37.76 kg/m   Physical Exam Vitals and nursing note reviewed.  Constitutional:      General: She is not in acute distress.    Appearance: Normal appearance.     Comments: Resting in the bed in NAD  HENT:     Head: Normocephalic and atraumatic.  Eyes:     Conjunctiva/sclera: Conjunctivae normal.     Pupils: Pupils are equal,  round, and reactive to light.  Cardiovascular:     Rate and Rhythm: Normal rate and regular rhythm.     Pulses: Normal pulses.  Pulmonary:     Effort: Pulmonary effort is normal. No respiratory distress.     Breath sounds: Normal breath sounds. No wheezing.     Comments: Speaking in full sentences.  Clear lung sounds in all fields. Abdominal:     General: There is no distension.     Palpations: Abdomen is soft.     Tenderness: There is abdominal tenderness in the epigastric area.     Comments: TTP of epigastric abd and RUQ abd  Musculoskeletal:        General: Normal range of motion.     Cervical back: Normal range of motion and neck supple.  Skin:    General: Skin is warm and dry.     Capillary Refill: Capillary refill takes less than 2 seconds.  Neurological:     Mental Status: She is alert and oriented to person, place, and time.  Psychiatric:        Mood and Affect: Mood and affect normal.        Speech: Speech normal.        Behavior: Behavior normal.    ED Results / Procedures / Treatments   Labs (all labs ordered are listed, but only abnormal results are displayed) Labs Reviewed  COMPREHENSIVE METABOLIC PANEL - Abnormal; Notable for the following components:      Result Value   AST 14 (*)    All other components within normal limits  CBC WITH DIFFERENTIAL/PLATELET  LIPASE, BLOOD  URINALYSIS, ROUTINE W REFLEX MICROSCOPIC  I-STAT BETA HCG BLOOD, ED ([facility], [facility], [facility] ONLY)    EKG None  Radiology CT Angio Chest PE W and/or Wo Contrast  Result Date: 03/08/2021 CLINICAL DATA:  PE suspected, high prob Patient reports epigastric pain radiating to the back. Recent admission for pancreatitis. Recent cholecystectomy EXAM: CT ANGIOGRAPHY CHEST WITH CONTRAST TECHNIQUE: Multidetector CT imaging of the chest was performed using the standard protocol during bolus administration of intravenous contrast. Multiplanar CT image reconstructions and MIPs were obtained to evaluate the  vascular anatomy. CONTRAST:  100mL OMNIPAQUE IOHEXOL 350 MG/ML SOLN COMPARISON:  Chest CTA 02/07/2021, additional priors FINDINGS: Cardiovascular: There are no filling defects within the pulmonary arteries to suggest pulmonary embolus. Normal caliber thoracic aorta. Normal heart size. No pericardial effusion. Mediastinum/Nodes: No enlarged mediastinal or hilar lymph nodes. No thyroid nodule.  No esophageal wall thickening. Lungs/Pleura: No acute airspace disease. No pleural fluid. No findings of pulmonary edema. The trachea and central bronchi are patent. No pneumothorax. Upper Abdomen: Assessed on concurrent abdominal CT, reported separately Musculoskeletal: There are no acute or suspicious osseous abnormalities. Review of the MIP images confirms the above findings. IMPRESSION: No pulmonary embolus or acute intrathoracic abnormality. Electronically Signed   By: Narda Rutherford M.D.   On: 03/08/2021 19:40   CT ABDOMEN PELVIS W CONTRAST  Result Date: 03/08/2021 CLINICAL DATA:  Nausea and vomiting. Epigastric pain. Recent admission for pancreatitis. Recent cholecystectomy. EXAM: CT ABDOMEN AND PELVIS WITH CONTRAST TECHNIQUE: Multidetector CT imaging of the abdomen and pelvis was performed using the standard protocol following bolus administration of intravenous contrast. CONTRAST:  OMNIPAQUE IOHEXOL 350 MG/ML SOLN COMPARISON:  Included portions from chest CT 02/07/2021 abdominal CT 08/14/2020. ultrasound 02/10/2021 FINDINGS: Lower chest: Assessed on concurrent chest CT, reported separately. Hepatobiliary: There is no definite CT correlate to the hyperechoic lesion on recent ultrasound. Post cholecystectomy. Minimal fluid in the cholecystectomy bed is typical of postsurgical change. No suspicious collection. There is no biliary dilatation. Pancreas: Previous peripancreatic fat stranding has resolved. No pancreatic ductal dilatation. No evidence of focal pancreatic mass. Spleen: Normal in size without focal  abnormality. Adrenals/Urinary Tract: Normal adrenal glands. No hydronephrosis or perinephric edema. Homogeneous renal enhancement. Unremarkable urinary bladder. Stomach/Bowel: The stomach is partially distended, unremarkable. Normal positioning of the duodenum and ligament of Treitz. No small bowel obstruction, inflammation or wall thickening. Appendectomy. Small to moderate volume of colonic stool. No colonic wall thickening or inflammatory change. Vascular/Lymphatic: Normal caliber abdominal aorta. Circumaortic left renal vein. Patent portal vein. Patent splenic vein. No enlarged lymph nodes in the abdomen or pelvis. Reproductive: Unremarkable CT appearance of the uterus and right ovary. There is a 3.4 cm left ovarian cyst. This appears similar to prior exam. Other: No free air, focal fluid collection or free fluid. No subcutaneous collection or inflammation. No abdominal wall hernia. Musculoskeletal: There are no acute or suspicious osseous abnormalities. IMPRESSION: 1. Post recent cholecystectomy with minimal fluid in the cholecystectomy bed, typical of postsurgical change. No abscess or biloma. No biliary dilatation. 2. No other acute findings.  No explanation for abdominal pain. 3. Left ovarian cyst measuring 3.4 cm, similar to prior exam. No follow-up imaging recommended. Note: This recommendation does not apply to premenarchal patients and to those with increased risk (genetic, family history, elevated tumor markers or other high-risk factors) of ovarian cancer. Reference: JACR 2020 Feb; 17(2):248-254 Electronically Signed   By: Narda Rutherford M.D.   On: 03/08/2021 19:47    Procedures Procedures   Medications Ordered in ED Medications  alum & mag hydroxide-simeth (MAALOX/MYLANTA) 200-200-20 MG/5ML suspension 30 mL (30 mLs Oral Given 03/08/21 1845)    And  lidocaine (XYLOCAINE) 2 % viscous mouth solution 15 mL (15 mLs Oral Given 03/08/21 1845)  pantoprazole (PROTONIX) EC tablet 40 mg (40 mg Oral  Given 03/08/21 1845)  ondansetron (ZOFRAN-ODT) disintegrating tablet 4 mg (4 mg Oral Given 03/08/21 1845)  iohexol (OMNIPAQUE) 350 MG/ML injection 100 mL (100 mLs Intravenous Contrast Given 03/08/21 1913)  haloperidol lactate (HALDOL) injection 2 mg (2 mg Intravenous Given 03/08/21 2116)    ED Course  I have reviewed the triage vital signs and the nursing notes.  Pertinent labs & imaging results that were available during my care of the patient were reviewed by me and considered in my medical decision making (see chart for details).  MDM Rules/Calculators/A&P                           Patient presenting for evaluation of epigastric pain, nausea, vomiting in the setting of recent cholecystectomy and pancreatitis.  On exam, patient appears uncomfortable due to pain, but otherwise nontoxic.  She is tender to palpation of epigastric and right upper quadrant abdomen.  Consider infection, perforation, retained stone, pancreatitis, gastritis, PUD, GERD.  Will obtain labs, treat symptomatically, imaging, and reassess.  Labs interpreted by me, overall reassuring.  LFTs, bili, and lipase are normal.  No leukocytosis and patient is without fever, doubt infection.  Patient tachycardic on arrival, had recent surgery and is on birth control.  Pain is in her back, and worse with inspiration, consider PE.   CTA negative for PE, and CT of the abdomen pelvis without signs of infection, frequent, retained stone, pancreatitis.  As such, likely GERD versus PUD versus gastritis.  Will treat symptomatically, ensure patient is able to tolerate p.o., plan for outpatient follow-up with GI.  After Haldol, patient states she is feeling better and would like to go home.  At this time, patient appears safe for discharge.  Return precautions given.  Patient states she understands and agrees to plan.   Final Clinical Impression(s) / ED Diagnoses Final diagnoses:  Upper abdominal pain    Rx / DC Orders ED Discharge  Orders          Ordered    sucralfate (CARAFATE) 1 GM/10ML suspension  3 times daily with meals & bedtime        03/08/21 2124             Alveria Apley, PA-C 03/08/21 2308    Charlynne Pander, MD 03/10/21 1623

## 2021-03-08 NOTE — ED Provider Notes (Signed)
Emergency Medicine Provider Triage Evaluation Note  Marissa Andrade , a 25 y.o. female  was evaluated in triage.  Pt complains of epigastric abd pain that radiates to the back. Has associated nv.  Review of Systems  Positive: Epigastric and back pain, nv Negative: fever  Physical Exam  BP (!) 115/103 (BP Location: Right Arm)   Pulse (!) 135   Temp 97.9 F (36.6 C) (Oral)   Resp 16   Ht 5\' 4"  (1.626 m)   Wt 99.8 kg   LMP 02/09/2021   SpO2 99%   BMI 37.76 kg/m  Gen:   Awake, no distress   Resp:  Normal effort  MSK:   Moves extremities without difficulty   Medical Decision Making  Medically screening exam initiated at 12:29 PM.  Appropriate orders placed.  Marissa Andrade was informed that the remainder of the evaluation will be completed by another provider, this initial triage assessment does not replace that evaluation, and the importance of remaining in the ED until their evaluation is complete.     Ether Griffins 03/08/21 1230    03/10/21, MD 03/09/21 1245

## 2021-03-08 NOTE — ED Triage Notes (Signed)
Pt reports 1 week of epigastric/abd pain that radiates to her back with n/v. Pt was recently admitted with pancreatitis and had her galbladder removed.

## 2021-03-08 NOTE — Discharge Instructions (Addendum)
Continue using Zofran as needed for nausea. Continue taking pantoprazole every day.decree stomach acid. Use the Carafate liquid before meals and before bedtime to help with stomach pain. Use Tylenol as needed for breakthrough pain. Follow-up with your GI doctor listed below for further evaluation of your pain. Return to the emergency room if you develop fevers, persistent vomiting, severe worsening pain, any new, worsening, or concerning symptoms.

## 2021-03-12 ENCOUNTER — Encounter: Payer: Self-pay | Admitting: Gastroenterology

## 2021-04-12 DIAGNOSIS — R590 Localized enlarged lymph nodes: Secondary | ICD-10-CM | POA: Diagnosis not present

## 2021-04-12 DIAGNOSIS — R599 Enlarged lymph nodes, unspecified: Secondary | ICD-10-CM | POA: Diagnosis not present

## 2021-04-12 DIAGNOSIS — R21 Rash and other nonspecific skin eruption: Secondary | ICD-10-CM | POA: Diagnosis not present

## 2021-04-12 DIAGNOSIS — B009 Herpesviral infection, unspecified: Secondary | ICD-10-CM | POA: Diagnosis not present

## 2021-04-12 DIAGNOSIS — L040 Acute lymphadenitis of face, head and neck: Secondary | ICD-10-CM | POA: Diagnosis not present

## 2021-04-12 DIAGNOSIS — B001 Herpesviral vesicular dermatitis: Secondary | ICD-10-CM | POA: Diagnosis not present

## 2021-04-19 ENCOUNTER — Encounter: Payer: Self-pay | Admitting: Gastroenterology

## 2021-04-19 ENCOUNTER — Ambulatory Visit (INDEPENDENT_AMBULATORY_CARE_PROVIDER_SITE_OTHER): Payer: BC Managed Care – PPO | Admitting: Gastroenterology

## 2021-04-19 VITALS — BP 120/80 | HR 84 | Ht 64.0 in | Wt 223.0 lb

## 2021-04-19 DIAGNOSIS — R1013 Epigastric pain: Secondary | ICD-10-CM | POA: Diagnosis not present

## 2021-04-19 DIAGNOSIS — Z9049 Acquired absence of other specified parts of digestive tract: Secondary | ICD-10-CM

## 2021-04-19 DIAGNOSIS — R1010 Upper abdominal pain, unspecified: Secondary | ICD-10-CM | POA: Diagnosis not present

## 2021-04-19 NOTE — Progress Notes (Signed)
HPI : Marissa Andrade is a very pleasant 25 year old female history of bipolar disorder and depression who is referred from the ER after a visit for abdominal pain.  She was diagnosed with pancreatitis on July 3, and although her initial ultrasound did not demonstrate the presence of stones, a repeat imaging showed apparently sludge and evidence of cholecystitis and she subsequently had a cholecystectomy at [facility].  She had problems with vomiting post op but otherwise had an uncomplicated recovery from surgery.   Her, ever since her surgery, she has had problems with recurrent abdominal pain.    Pain is located in the epigastrium and radiates to the back.  Pain is worse when she is hungry.  Lasts 20-30 minutes, associated with nausea and sometimes vomiting.  Pain is often 7-8 out of 10.  Noticed more in the evening and upon wakening.  Pain is very similar to her pancreatitis pain, but not nearly as severe.  The pain has not improved or worsened in the past month.  Appetite good, weight is stable.  Has diarrhea frequently.  Usually 3-4 bowel movements/day, different compared to prior to cholecystectomy (1 every 2-3 days). She was seen in the ER on July 29 with for this abdominal pain.  Labs in the emergency room were unremarkable including liver enzymes and lipase.  CT of the abdomen/pelvis was unremarkable.  CTPA was also negative for pulmonary embolism.  She was given carafate which she takes as needed.  She was also prescribed a PPI which did not help.  She rarely takes NSAIDs. No family history of PUD, gallstones, IBD.  Sister has IBS.   Past Medical History:  Diagnosis Date   Blood transfusion without reported diagnosis    Depression    PPD   Gestational diabetes    History of gestational hypertension    Tachycardia 07/05/2017   postpartum, spent 1 week in hospital, no further diagnosis     Past Surgical History:  Procedure Laterality Date   BREAST SURGERY     CESAREAN SECTION      CESAREAN SECTION N/A 08/22/2019   Procedure: CESAREAN SECTION;  Surgeon: Philip Aspen, DO;  Location: [facility] LD ORS;  Service: Obstetrics;  Laterality: N/A;   CHOLECYSTECTOMY     Family History  Problem Relation Age of Onset   Hypertension Other    Colon cancer Neg Hx    Colon polyps Neg Hx    Social History   Tobacco Use   Smoking status: Never   Smokeless tobacco: Never  Vaping Use   Vaping Use: Never used  Substance Use Topics   Alcohol use: Not Currently   Drug use: Never   Current Outpatient Medications  Medication Sig Dispense Refill   hydrOXYzine (ATARAX/VISTARIL) 25 MG tablet Take 1 tablet (25 mg total) by mouth every 6 (six) hours as needed for anxiety. 30 tablet 1   lamoTRIgine (LAMICTAL) 100 MG tablet Take 100 mg by mouth daily.     sucralfate (CARAFATE) 1 GM/10ML suspension Take 10 mLs (1 g total) by mouth 4 (four) times daily -  with meals and at bedtime. 420 mL 0   No current facility-administered medications for this visit.   No Known Allergies   Review of Systems: All systems reviewed and negative except where noted in HPI.    No results found.  Physical Exam: BP 120/80   Pulse 84   Ht 5\' 4"  (1.626 m)   Wt 223 lb (101.2 kg)   BMI 38.28 kg/m  Constitutional: Pleasant,well-developed, obese Caucasian female in no acute distress. HEENT: Normocephalic and atraumatic. Conjunctivae are normal. No scleral icterus. MP2 Cardiovascular: Normal rate, regular rhythm.  Pulmonary/chest: Effort normal and breath sounds normal. No wheezing, rales or rhonchi. Abdominal: Soft, nondistended, tenderness to palpation in the epigastrium. Bowel sounds active throughout. There are no masses palpable. No hepatomegaly. Extremities: no edema Neurological: Alert and oriented to person place and time. Skin: Skin is warm and dry. No rashes noted. Psychiatric: Normal mood and affect. Behavior is normal.  CBC    Component Value Date/Time   WBC 5.8 03/08/2021 1231   RBC  4.64 03/08/2021 1231   HGB 14.1 03/08/2021 1231   HCT 41.3 03/08/2021 1231   PLT 197 03/08/2021 1231   MCV 89.0 03/08/2021 1231   MCH 30.4 03/08/2021 1231   MCHC 34.1 03/08/2021 1231   RDW 12.5 03/08/2021 1231   LYMPHSABS 2.0 03/08/2021 1231   MONOABS 0.5 03/08/2021 1231   EOSABS 0.1 03/08/2021 1231   BASOSABS 0.0 03/08/2021 1231    CMP     Component Value Date/Time   NA 137 03/08/2021 1231   K 3.9 03/08/2021 1231   CL 103 03/08/2021 1231   CO2 24 03/08/2021 1231   GLUCOSE 92 03/08/2021 1231   BUN 12 03/08/2021 1231   CREATININE 0.85 03/08/2021 1231   CALCIUM 9.1 03/08/2021 1231   PROT 7.2 03/08/2021 1231   ALBUMIN 3.9 03/08/2021 1231   AST 14 (L) 03/08/2021 1231   ALT 18 03/08/2021 1231   ALKPHOS 50 03/08/2021 1231   BILITOT 1.0 03/08/2021 1231   GFRNONAA >60 03/08/2021 1231   GFRAA >60 06/13/2019 0430  CLINICAL DATA:  PE suspected, high prob   Patient reports epigastric pain radiating to the back. Recent admission for pancreatitis. Recent cholecystectomy   EXAM: CT ANGIOGRAPHY CHEST WITH CONTRAST   TECHNIQUE: Multidetector CT imaging of the chest was performed using the standard protocol during bolus administration of intravenous contrast. Multiplanar CT image reconstructions and MIPs were obtained to evaluate the vascular anatomy.   CONTRAST:  OMNIPAQUE IOHEXOL 350 MG/ML SOLN   COMPARISON:  Chest CTA 02/07/2021, additional priors   FINDINGS: Cardiovascular: There are no filling defects within the pulmonary arteries to suggest pulmonary embolus. Normal caliber thoracic aorta. Normal heart size. No pericardial effusion.   Mediastinum/Nodes: No enlarged mediastinal or hilar lymph nodes. No thyroid nodule. No esophageal wall thickening.   Lungs/Pleura: No acute airspace disease. No pleural fluid. No findings of pulmonary edema. The trachea and central bronchi are patent. No pneumothorax.   Upper Abdomen: Assessed on concurrent abdominal CT,  reported separately   Musculoskeletal: There are no acute or suspicious osseous abnormalities.   Review of the MIP images confirms the above findings.   IMPRESSION: No pulmonary embolus or acute intrathoracic abnormality.     Electronically Signed   By: Narda Rutherford M.D.   On: 03/08/2021 19:40    CLINICAL DATA:  Nausea and vomiting. Epigastric pain. Recent admission for pancreatitis. Recent cholecystectomy.   EXAM: CT ABDOMEN AND PELVIS WITH CONTRAST   TECHNIQUE: Multidetector CT imaging of the abdomen and pelvis was performed using the standard protocol following bolus administration of intravenous contrast.   CONTRAST:  OMNIPAQUE IOHEXOL 350 MG/ML SOLN   COMPARISON:  Included portions from chest CT 02/07/2021 abdominal CT 08/14/2020. ultrasound 02/10/2021   FINDINGS: Lower chest: Assessed on concurrent chest CT, reported separately.   Hepatobiliary: There is no definite CT correlate to the hyperechoic lesion on recent ultrasound. Post  cholecystectomy. Minimal fluid in the cholecystectomy bed is typical of postsurgical change. No suspicious collection. There is no biliary dilatation.   Pancreas: Previous peripancreatic fat stranding has resolved. No pancreatic ductal dilatation. No evidence of focal pancreatic mass.   Spleen: Normal in size without focal abnormality.   Adrenals/Urinary Tract: Normal adrenal glands. No hydronephrosis or perinephric edema. Homogeneous renal enhancement. Unremarkable urinary bladder.   Stomach/Bowel: The stomach is partially distended, unremarkable. Normal positioning of the duodenum and ligament of Treitz. No small bowel obstruction, inflammation or wall thickening. Appendectomy. Small to moderate volume of colonic stool. No colonic wall thickening or inflammatory change.   Vascular/Lymphatic: Normal caliber abdominal aorta. Circumaortic left renal vein. Patent portal vein. Patent splenic vein. No enlarged lymph  nodes in the abdomen or pelvis.   Reproductive: Unremarkable CT appearance of the uterus and right ovary. There is a 3.4 cm left ovarian cyst. This appears similar to prior exam.   Other: No free air, focal fluid collection or free fluid. No subcutaneous collection or inflammation. No abdominal wall hernia.   Musculoskeletal: There are no acute or suspicious osseous abnormalities.   IMPRESSION: 1. Post recent cholecystectomy with minimal fluid in the cholecystectomy bed, typical of postsurgical change. No abscess or biloma. No biliary dilatation. 2. No other acute findings.  No explanation for abdominal pain. 3. Left ovarian cyst measuring 3.4 cm, similar to prior exam. No follow-up imaging recommended. Note: This recommendation does not apply to premenarchal patients and to those with increased risk (genetic, family history, elevated tumor markers or other high-risk factors) of ovarian cancer. Reference: JACR 2020 Feb; 17(2):248-254     Electronically Signed   By: Narda Rutherford M.D.   On: 03/08/2021 19:47    ASSESSMENT AND PLAN: 25 year old female with recent episode of gallstone pancreatitis status post laparoscopic cholecystectomy, with persistent epigastric pain radiating to the back similar to her pancreatitis pain but less severe.  Repeat lipase and CT have been unremarkable.  No red flag symptoms.  Possible etiologies include peptic ulcer disease/H. pylori versus sphincter of Oddi dysfunction versus gut brain axis disorder.  We discussed each of these conditions in the evaluation for each.  We will plan for an upper endoscopy to evaluate for peptic ulcer disease and H. pylori.  I suggested that next time she has a severe episode, to go to urgent care or the emergency room for an ultrasound and liver panel to assess for possible SOD, although the likelihood of this disorder is very low given her normal CT and liver enzymes 6 weeks ago.  Epigastric pain -EGD to r/o PUD/H.  Pylori -US/LFTs with next attack to eval for SOD -Discussed pathophysiology and management of GBADs -Okay to continue Carafate given subjective improvement, no need for PPI since no improvement after 3 weeks   The details, risks (including bleeding, perforation, infection, missed lesions, medication reactions and possible hospitalization or surgery if complications occur), benefits, and alternatives to EGD with possible biopsy and possible dilation were discussed with the patient and she consents to proceed.   Salimah Martinovich E. Tomasa Rand, MD [facility]    CC: Lise Auer, MD

## 2021-04-19 NOTE — Patient Instructions (Signed)
If you are age 26 or older, your body mass index should be between 23-30. Your Body mass index is 38.28 kg/m. If this is out of the aforementioned range listed, please consider follow up with your Primary Care Provider.  If you are age 76 or younger, your body mass index should be between 19-25. Your Body mass index is 38.28 kg/m. If this is out of the aformentioned range listed, please consider follow up with your Primary Care Provider.   You have been scheduled for an endoscopy. Please follow written instructions given to you at your visit today. If you use inhalers (even only as needed), please bring them with you on the day of your procedure.  The [facility] providers would like to encourage you to use [facility] to communicate with providers for non-urgent requests or questions.  Due to long hold times on the telephone, sending your provider a message by [facility] may be a faster and more efficient way to get a response.  Please allow 48 business hours for a response.  Please remember that this is for non-urgent requests.   It was a pleasure to see you today!  Thank you for trusting me with your gastrointestinal care!    Scott E.Tomasa Rand, MD

## 2021-04-23 ENCOUNTER — Encounter: Payer: Self-pay | Admitting: Gastroenterology

## 2021-04-23 ENCOUNTER — Ambulatory Visit (AMBULATORY_SURGERY_CENTER): Payer: BC Managed Care – PPO | Admitting: Gastroenterology

## 2021-04-23 ENCOUNTER — Other Ambulatory Visit: Payer: Self-pay

## 2021-04-23 VITALS — BP 117/68 | HR 73 | Temp 97.6°F | Resp 14 | Ht 64.0 in | Wt 223.0 lb

## 2021-04-23 DIAGNOSIS — R1013 Epigastric pain: Secondary | ICD-10-CM

## 2021-04-23 DIAGNOSIS — K2950 Unspecified chronic gastritis without bleeding: Secondary | ICD-10-CM | POA: Diagnosis not present

## 2021-04-23 DIAGNOSIS — K2970 Gastritis, unspecified, without bleeding: Secondary | ICD-10-CM

## 2021-04-23 DIAGNOSIS — K2980 Duodenitis without bleeding: Secondary | ICD-10-CM

## 2021-04-23 DIAGNOSIS — K3189 Other diseases of stomach and duodenum: Secondary | ICD-10-CM

## 2021-04-23 MED ORDER — SODIUM CHLORIDE 0.9 % IV SOLN: 500.0000 mL | Freq: Once | INTRAVENOUS | Status: AC

## 2021-04-23 MED ORDER — FAMOTIDINE 40 MG PO TABS: 40.0000 mg | ORAL_TABLET | Freq: Every day | ORAL | 0 refills | Status: AC

## 2021-04-23 NOTE — Progress Notes (Signed)
Called to room to assist during endoscopic procedure.  Patient ID and intended procedure confirmed with present staff. Received instructions for my participation in the procedure from the performing physician.  

## 2021-04-23 NOTE — Progress Notes (Signed)
To PACU, VSS. Report to [email or web address] 

## 2021-04-23 NOTE — Progress Notes (Signed)
VS completed by CW. ? ?Medical history reviewed and updated. ? ?

## 2021-04-23 NOTE — Op Note (Signed)
[facility] Patient Name: Marissa Andrade Procedure Date: 04/23/2021 8:33 AM MRN: [id number] Endoscopist: Lorin Picket E. Tomasa Rand , MD Age: 25 Referring MD:  Date of Birth: November 18, 1995 Gender: Female Account #: [id number] Procedure:                Upper GI endoscopy Indications:              Epigastric abdominal pain Medicines:                Monitored Anesthesia Care Procedure:                Pre-Anesthesia Assessment:                           - Prior to the procedure, a History and Physical                            was performed, and patient medications and                            allergies were reviewed. The patient's tolerance of                            previous anesthesia was also reviewed. The risks                            and benefits of the procedure and the sedation                            options and risks were discussed with the patient.                            All questions were answered, and informed consent                            was obtained. Prior Anticoagulants: The patient has                            taken no previous anticoagulant or antiplatelet                            agents. ASA Grade Assessment: II - A patient with                            mild systemic disease. After reviewing the risks                            and benefits, the patient was deemed in                            satisfactory condition to undergo the procedure.                           After obtaining informed consent, the endoscope was  passed under direct vision. Throughout the                            procedure, the patient's blood pressure, pulse, and                            oxygen saturations were monitored continuously. The                            Endoscope was introduced through the mouth, and                            advanced to the third part of duodenum. The upper                            GI endoscopy was  accomplished without difficulty.                            The patient tolerated the procedure well. Scope In: Scope Out: Findings:                 The examined portions of the nasopharynx,                            oropharynx and larynx were normal.                           The examined esophagus was normal.                           Striped moderately erythematous mucosa without                            bleeding was found in the gastric body. Biopsies                            were taken with a cold forceps for Helicobacter                            pylori testing. Estimated blood loss was minimal.                           The exam of the stomach was otherwise normal.                           Patchy mildly erythematous mucosa without active                            bleeding and with no stigmata of bleeding was found                            in the duodenal bulb. Biopsies were taken with a  cold forceps for histology. Estimated blood loss                            was minimal.                           The exam of the duodenum was otherwise normal. Complications:            No immediate complications. Estimated Blood Loss:     Estimated blood loss was minimal. Impression:               - The examined portions of the nasopharynx,                            oropharynx and larynx were normal.                           - Normal esophagus.                           - Erythematous mucosa in the gastric body. Biopsied.                           - Erythematous duodenopathy. Biopsied.                           - Unclear if this would explain the patient's                            symptoms. Recommendation:           - Patient has a contact number available for                            emergencies. The signs and symptoms of potential                            delayed complications were discussed with the                            patient. Return to  normal activities tomorrow.                            Written discharge instructions were provided to the                            patient.                           - Resume previous diet.                           - Continue present medications.                           - Await pathology results.                           -  Use Pepcid (famotidine) 40 mg PO daily for 4                            weeks.                           - Follow up in GI clinic in 3-4 weeks as needed Jahmil Macleod E. Tomasa Rand, MD 04/23/2021 9:00:20 AM This report has been signed electronically.

## 2021-04-23 NOTE — Progress Notes (Signed)
History and Physical Interval Note:  04/23/2021 8:41 AM  Marissa Andrade  has presented today for endoscopic procedure(s), with the diagnosis of  Encounter Diagnosis  Name Primary?   Abdominal pain, epigastric Yes  .  The various methods of evaluation and treatment have been discussed with the patient and/or family. After consideration of risks, benefits and other options for treatment, the patient has consented to  the endoscopic procedure(s).   The patient's history has been reviewed, patient examined, no change in status, stable for endoscopic procedure(s).  I have reviewed the patient's chart and labs.  Questions were answered to the patient's satisfaction.     [name] E. Tomasa Rand, MD [facility]

## 2021-04-23 NOTE — Patient Instructions (Signed)
Resume previous diet and continue present medications. Awaiting pathology results. Use Pepcid 40 mg by mouth daily for 4 weeks. Follow up in GI clinic in 3-4 weeks as needed.  YOU HAD AN ENDOSCOPIC PROCEDURE TODAY AT THE  ENDOSCOPY CENTER:   Refer to the procedure report that was given to you for any specific questions about what was found during the examination.  If the procedure report does not answer your questions, please call your gastroenterologist to clarify.  If you requested that your care partner not be given the details of your procedure findings, then the procedure report has been included in a sealed envelope for you to review at your convenience later.  YOU SHOULD EXPECT: Some feelings of bloating in the abdomen. Passage of more gas than usual.  Walking can help get rid of the air that was put into your GI tract during the procedure and reduce the bloating. If you had a lower endoscopy (such as a colonoscopy or flexible sigmoidoscopy) you may notice spotting of blood in your stool or on the toilet paper. If you underwent a bowel prep for your procedure, you may not have a normal bowel movement for a few days.  Please Note:  You might notice some irritation and congestion in your nose or some drainage.  This is from the oxygen used during your procedure.  There is no need for concern and it should clear up in a day or so.  SYMPTOMS TO REPORT IMMEDIATELY:  Following upper endoscopy (EGD)  Vomiting of blood or coffee ground material  New chest pain or pain under the shoulder blades  Painful or persistently difficult swallowing  New shortness of breath  Fever of 100F or higher  Black, tarry-looking stools  For urgent or emergent issues, a gastroenterologist can be reached at any hour by calling [phone]. Do not use MyChart messaging for urgent concerns.    DIET:  We do recommend a small meal at first, but then you may proceed to your regular diet.  Drink plenty of  fluids but you should avoid alcoholic beverages for 24 hours.  ACTIVITY:  You should plan to take it easy for the rest of today and you should NOT DRIVE or use heavy machinery until tomorrow (because of the sedation medicines used during the test).    FOLLOW UP: Our staff will call the number listed on your records 48-72 hours following your procedure to check on you and address any questions or concerns that you may have regarding the information given to you following your procedure. If we do not reach you, we will leave a message.  We will attempt to reach you two times.  During this call, we will ask if you have developed any symptoms of COVID 19. If you develop any symptoms (ie: fever, flu-like symptoms, shortness of breath, cough etc.) before then, please call [phone].  If you test positive for Covid 19 in the 2 weeks post procedure, please call and report this information to [place].    If any biopsies were taken you will be contacted by phone or by letter within the next 1-3 weeks.  Please call us at [phone] if you have not heard about the biopsies in 3 weeks.    SIGNATURES/CONFIDENTIALITY: You and/or your care partner have signed paperwork which will be entered into your electronic medical record.  These signatures attest to the fact that that the information above on your After Visit Summary has been reviewed and is understood.  Full responsibility of the confidentiality of this discharge information lies with you and/or your care-partner.  

## 2021-04-25 ENCOUNTER — Telehealth: Payer: Self-pay | Admitting: *Deleted

## 2021-04-25 NOTE — Telephone Encounter (Signed)
  Follow up Call-  Call back number 04/23/2021  Post procedure Call Back phone  # [phone]  Permission to leave phone message Yes  Some recent data might be hidden     Patient questions:  Do you have a fever, pain , or abdominal swelling? No. Pain Score  0 *  Have you tolerated food without any problems? Yes.    Have you been able to return to your normal activities? Yes.    Do you have any questions about your discharge instructions: Diet   No. Medications  No. Follow up visit  No.  Do you have questions or concerns about your Care? No.  Actions: * If pain score is 4 or above: No action needed, pain <4.   Have you developed a fever since your procedure? no  2.   Have you had an respiratory symptoms (SOB or cough) since your procedure? no  3.   Have you tested positive for COVID 19 since your procedure no  4.   Have you had any family members/close contacts diagnosed with the COVID 19 since your procedure?  no   If yes to any of these questions please route to Laverna Peace, RN and Karlton Lemon, RN

## 2021-05-01 ENCOUNTER — Encounter: Payer: Self-pay | Admitting: Gastroenterology

## 2021-05-13 ENCOUNTER — Other Ambulatory Visit: Payer: Self-pay | Admitting: Gastroenterology

## 2021-05-13 DIAGNOSIS — K2970 Gastritis, unspecified, without bleeding: Secondary | ICD-10-CM

## 2021-06-14 DIAGNOSIS — J101 Influenza due to other identified influenza virus with other respiratory manifestations: Secondary | ICD-10-CM | POA: Diagnosis not present

## 2021-06-14 DIAGNOSIS — R509 Fever, unspecified: Secondary | ICD-10-CM | POA: Diagnosis not present

## 2021-06-14 DIAGNOSIS — R051 Acute cough: Secondary | ICD-10-CM | POA: Diagnosis not present

## 2021-07-16 DIAGNOSIS — Z6841 Body Mass Index (BMI) 40.0 and over, adult: Secondary | ICD-10-CM | POA: Diagnosis not present

## 2021-07-16 DIAGNOSIS — F319 Bipolar disorder, unspecified: Secondary | ICD-10-CM | POA: Diagnosis not present

## 2021-07-16 DIAGNOSIS — Z30017 Encounter for initial prescription of implantable subdermal contraceptive: Secondary | ICD-10-CM | POA: Diagnosis not present

## 2021-08-22 ENCOUNTER — Ambulatory Visit (HOSPITAL_COMMUNITY)
Admission: EM | Admit: 2021-08-22 | Discharge: 2021-08-22 | Disposition: A | Discharge status: Home / Self Care | Payer: BC Managed Care – PPO | Attending: Psychiatry | Admitting: Psychiatry

## 2021-08-22 DIAGNOSIS — F32A Depression, unspecified: Secondary | ICD-10-CM

## 2021-08-22 MED ORDER — HYDROXYZINE PAMOATE 25 MG PO CAPS: 25.0000 mg | ORAL_CAPSULE | Freq: Three times a day (TID) | ORAL | 0 refills | Status: DC | PRN

## 2021-08-22 NOTE — ED Provider Notes (Signed)
Behavioral Health Urgent Care Medical Screening Exam  Patient Name: Marissa Andrade MRN: [id number] Date of Evaluation: 08/22/21 Chief Complaint:   Diagnosis:  Final diagnoses:  Depression, unspecified depression type    History of Present illness: Marissa Andrade is a 26 y.o. female. patient presented to [facility] as a walk in  accompanied by boyfriend with complaints of anxiety.  Marissa Andrade, 26 y.o., female patient seen face to face by this provider, consulted with Dr. Serafina Mitchell; and chart reviewed on 08/22/21.  On evaluation Marissa Andrade reports that she ran out of her Xanax and her PCP only provided a one time prescription. Patient reported passive SI but was able to contract for safety today. She denies the possibility of being pregnant and reports that she is compliant with Limictal (which was prescribed by her PCP). Patient stated that she was diagnosed with bipolar d/o by her PCP. During evaluation Marissa Andrade is in sitting (position) in no acute distress.  She is alert/oriented x 4; calm/cooperative; and mood congruent with affect.  She is speaking in a clear tone at moderate volume, and normal pace; with good eye contact. Her thought process is coherent and relevant; There is no indication that she is currently responding to internal/external stimuli or experiencing delusional thought content; and she has denied homicidal ideation, psychosis, and paranoia.   Patient has remained calm throughout assessment and has answered questions appropriately.     At this time Marissa Andrade is educated and verbalizes understanding of mental health resources and other crisis services in the community. She is instructed to call 911 and present to the nearest emergency room should she experience any suicidal/homicidal ideation, auditory/visual/hallucinations, or detrimental worsening of her mental health condition.    Psychiatric Specialty Exam  Presentation  General Appearance:Appropriate for  Environment  Eye Contact:Good  Speech:Clear and Coherent  Speech Volume:Normal  Handedness:Right   Mood and Affect  Mood:Depressed  Affect:Congruent; Depressed   Thought Process  Thought Processes:Coherent  Descriptions of Associations:Intact  Orientation:Full (Time, Place and Person)  Thought Content:Other (comment) (passive SI)    Hallucinations:None  Ideas of Reference:None  Suicidal Thoughts:Yes, Passive Without Intent; Without Plan  Homicidal Thoughts:No   Sensorium  Memory:Immediate Good; Recent Good; Remote Good  Judgment:Good  Insight:Good   Executive Functions  Concentration:Good  Attention Span:Good  [place]  Language:Good   Psychomotor Activity  Psychomotor Activity:Normal   Assets  Assets:Communication Skills; Desire for Improvement; Housing; Social Support   Sleep  Sleep:Good  Number of hours: 8   No data recorded  Physical Exam: Physical Exam Vitals reviewed.  Pulmonary:     Effort: Pulmonary effort is normal.  Skin:    General: Skin is warm and dry.  Neurological:     General: No focal deficit present.     Mental Status: She is alert and oriented to person, place, and time.  Psychiatric:        Attention and Perception: Attention normal.        Mood and Affect: Mood is depressed.        Speech: Speech normal.        Behavior: Behavior is cooperative.        Thought Content: Thought content is not paranoid or delusional. Thought content includes suicidal ideation. Thought content does not include homicidal ideation. Thought content does not include homicidal or suicidal plan.        Cognition and Memory: Cognition and memory normal.        Judgment: Judgment  normal.   Review of Systems  Psychiatric/Behavioral:  Positive for depression and suicidal ideas. Negative for hallucinations, memory loss and substance abuse. The patient is not nervous/anxious and does not have insomnia.   All  other systems reviewed and are negative. Blood pressure 120/86, pulse 99, temperature 98.2 F (36.8 C), temperature source Oral, resp. rate 18, SpO2 99 %, unknown if currently breastfeeding. There is no height or weight on file to calculate BMI.  Musculoskeletal: Strength & Muscle Tone: within normal limits Gait & Station: normal Patient leans: N/A   [place] [facility] Discharge Disposition for Follow up and Recommendations: Based on my evaluation the patient does not appear to have an emergency medical condition and can be discharged with resources and follow up care in outpatient services for Medication Management and individual therapy. Patient provided with script for Hydroxyzine 50 mg PO TID PRN    Franne Grip, NP 08/22/2021, 3:14 PM

## 2021-08-22 NOTE — Discharge Instructions (Addendum)

## 2021-08-22 NOTE — ED Notes (Addendum)
Pt given AVS and follow up information.  Pt was given RX by provider.   She verbalized understanding of instructions.

## 2021-08-22 NOTE — ED Triage Notes (Signed)
Pt Marissa Andrade presents to [facility] with SI and a request for medication management. Pt states that she has been diagnosed with bipolar II and her medication has not been working properly. Pt states that she has been having passive SI but does not have a plan. Pt states that she would benefit from a psychiatrist referral and medication management. Pt denies HI and AVH at this time. Pt is urgent

## 2021-08-29 DIAGNOSIS — F1920 Other psychoactive substance dependence, uncomplicated: Secondary | ICD-10-CM | POA: Diagnosis not present

## 2021-08-29 DIAGNOSIS — F3181 Bipolar II disorder: Secondary | ICD-10-CM | POA: Diagnosis not present

## 2021-09-03 ENCOUNTER — Telehealth (HOSPITAL_COMMUNITY): Payer: Self-pay | Admitting: Family Medicine

## 2021-09-03 NOTE — BH Assessment (Signed)
Care Management - [facility] Follow Up Discharges  ° °Writer attempted to make contact with patient today and was unsuccessful.  Writer left a HIPPA compliant voice message.  ° °Per chart review, patient was provided with outpatient resources. ° °

## 2021-09-13 DIAGNOSIS — F3132 Bipolar disorder, current episode depressed, moderate: Secondary | ICD-10-CM | POA: Diagnosis not present

## 2021-09-13 DIAGNOSIS — F411 Generalized anxiety disorder: Secondary | ICD-10-CM | POA: Diagnosis not present

## 2021-09-13 DIAGNOSIS — F4310 Post-traumatic stress disorder, unspecified: Secondary | ICD-10-CM | POA: Diagnosis not present

## 2021-10-01 DIAGNOSIS — R161 Splenomegaly, not elsewhere classified: Secondary | ICD-10-CM | POA: Diagnosis not present

## 2021-10-01 DIAGNOSIS — R1013 Epigastric pain: Secondary | ICD-10-CM | POA: Diagnosis not present

## 2021-10-01 DIAGNOSIS — R109 Unspecified abdominal pain: Secondary | ICD-10-CM | POA: Diagnosis not present

## 2021-10-01 DIAGNOSIS — K2970 Gastritis, unspecified, without bleeding: Secondary | ICD-10-CM | POA: Diagnosis not present

## 2021-10-01 DIAGNOSIS — R7401 Elevation of levels of liver transaminase levels: Secondary | ICD-10-CM | POA: Diagnosis not present

## 2021-10-02 DIAGNOSIS — F411 Generalized anxiety disorder: Secondary | ICD-10-CM | POA: Diagnosis not present

## 2021-10-02 DIAGNOSIS — F4310 Post-traumatic stress disorder, unspecified: Secondary | ICD-10-CM | POA: Diagnosis not present

## 2021-10-02 DIAGNOSIS — F3132 Bipolar disorder, current episode depressed, moderate: Secondary | ICD-10-CM | POA: Diagnosis not present

## 2021-10-04 DIAGNOSIS — Z6841 Body Mass Index (BMI) 40.0 and over, adult: Secondary | ICD-10-CM | POA: Diagnosis not present

## 2021-10-04 DIAGNOSIS — R748 Abnormal levels of other serum enzymes: Secondary | ICD-10-CM | POA: Diagnosis not present

## 2021-10-04 DIAGNOSIS — R161 Splenomegaly, not elsewhere classified: Secondary | ICD-10-CM | POA: Diagnosis not present

## 2021-10-04 DIAGNOSIS — K279 Peptic ulcer, site unspecified, unspecified as acute or chronic, without hemorrhage or perforation: Secondary | ICD-10-CM | POA: Diagnosis not present

## 2021-10-17 ENCOUNTER — Other Ambulatory Visit (INDEPENDENT_AMBULATORY_CARE_PROVIDER_SITE_OTHER): Payer: BC Managed Care – PPO

## 2021-10-17 ENCOUNTER — Encounter: Payer: Self-pay | Admitting: Gastroenterology

## 2021-10-17 ENCOUNTER — Ambulatory Visit: Payer: BC Managed Care – PPO | Admitting: Gastroenterology

## 2021-10-17 VITALS — BP 132/90 | HR 95 | Ht 64.0 in | Wt 239.5 lb

## 2021-10-17 DIAGNOSIS — Z9049 Acquired absence of other specified parts of digestive tract: Secondary | ICD-10-CM

## 2021-10-17 DIAGNOSIS — R1084 Generalized abdominal pain: Secondary | ICD-10-CM

## 2021-10-17 DIAGNOSIS — R748 Abnormal levels of other serum enzymes: Secondary | ICD-10-CM | POA: Diagnosis not present

## 2021-10-17 LAB — HEPATIC FUNCTION PANEL
ALT: 11 U/L (ref 0–35)
AST: 12 U/L (ref 0–37)
Albumin: 4.6 g/dL (ref 3.5–5.2)
Alkaline Phosphatase: 59 U/L (ref 39–117)
Bilirubin, Direct: 0.1 mg/dL (ref 0.0–0.3)
Total Bilirubin: 0.4 mg/dL (ref 0.2–1.2)
Total Protein: 7.6 g/dL (ref 6.0–8.3)

## 2021-10-17 LAB — LIPASE: Lipase: 18 U/L (ref 11.0–59.0)

## 2021-10-17 MED ORDER — HYOSCYAMINE SULFATE 0.125 MG SL SUBL
0.1250 mg | SUBLINGUAL_TABLET | Freq: Four times a day (QID) | SUBLINGUAL | 1 refills | Status: DC | PRN
Start: 2021-10-17 — End: 2022-07-28

## 2021-10-17 NOTE — Progress Notes (Signed)
? ?HPI : Marissa Andrade is a very pleasant 27 year old female with a history of anxiety who was admitted for gallstone pancreatitis in July 2022.  She underwent a cholecystectomy during that admission.  She is referred to [place] by Dr.Jaber Humphrey Rolls because she has been having persistent episodic abdominal pain ever since her admission.  She has been having episodes of pain occurring about every other day and frequency.  The pain starts in her upper abdomen and radiates to her back.  It is associated with nausea but no vomiting.  Sometimes the pain is severe, up to 8/10.  She states that the pain feels very similar to her pancreatitis pain, but is not quite as severe.  She typically has the urge to defecate with these episodes, and frequently passes liquid stool.  Otherwise, she denies any other symptoms to go along with the pain.  Pain typically last between 30 and 60 minutes.  She has not identified any triggers or patterns to the pain.  It is not necessarily associated with eating.  She has noted that its more often in the morning as opposed to the evening. ?She follows a regular diet, other than avoiding fatty foods since her cholecystectomy. ?Her weight has been stable.  She has regular bowel movements, except for when she has loose stools during the pain episodes.  She denies any GI symptoms in between these isolated episodes of pain. ?Prior to her gallstone pancreatitis, she denied any chronic GI symptoms. ? ?Patient states that she recently saw her primary doctor during 1 of these episodes of pain, and her liver enzymes were checked and were elevated (ALT 200, T. bili, alk phos unremarkable).  She had a right upper quadrant ultrasound which showed fatty change in the liver but no other abnormalities (patient has printed out report from outside facility).  The diameter of the common bile duct was not specifically mentioned in the report. ? ? ?Past Medical History:  ?Diagnosis Date  ? Anxiety   ? Blood transfusion  without reported diagnosis   ? Depression   ? PPD  ? History of gestational hypertension   ? Tachycardia 07/05/2017  ? postpartum, spent 1 week in hospital, no further diagnosis  ? ? ? ?Past Surgical History:  ?Procedure Laterality Date  ? APPENDECTOMY    ? BREAST SURGERY    ? CESAREAN SECTION    ? CESAREAN SECTION N/A 08/22/2019  ? Procedure: CESAREAN SECTION;  Surgeon: Allyn Kenner, DO;  Location: [facility] LD ORS;  Service: Obstetrics;  Laterality: N/A;  ? CHOLECYSTECTOMY    ? ?Family History  ?Problem Relation Age of Onset  ? Hypertension Other   ? Colon cancer Neg Hx   ? Colon polyps Neg Hx   ? Rectal cancer Neg Hx   ? Stomach cancer Neg Hx   ? ?Social History  ? ?Tobacco Use  ? Smoking status: Never  ? Smokeless tobacco: Never  ?Vaping Use  ? Vaping Use: Never used  ?Substance Use Topics  ? Alcohol use: Not Currently  ? Drug use: Never  ? ?Current Outpatient Medications  ?Medication Sig Dispense Refill  ? famotidine (PEPCID) 40 MG tablet Take 1 tablet (40 mg total) by mouth daily for 28 days. 28 tablet 0  ? hydrOXYzine (VISTARIL) 25 MG capsule Take 1 capsule (25 mg total) by mouth 3 (three) times daily as needed for anxiety. 30 capsule 0  ? JUNEL FE 24 1-20 MG-MCG(24) tablet Take 1 tablet by mouth daily.    ? lamoTRIgine (  LAMICTAL) 100 MG tablet Take 100 mg by mouth daily.    ? sucralfate (CARAFATE) 1 GM/10ML suspension Take 10 mLs (1 g total) by mouth 4 (four) times daily -  with meals and at bedtime. 420 mL 0  ? ?Current Facility-Administered Medications  ?Medication Dose Route Frequency Provider Last Rate Last Admin  ? 0.9 %  sodium chloride infusion  500 mL Intravenous Once Daryel November, MD      ? ?No Known Allergies ? ? ?Review of Systems: ?All systems reviewed and negative except where noted in HPI.  ? ? ?No results found. ? ?Physical Exam: ?There were no vitals taken for this visit. ?Constitutional: Pleasant,well-developed, Caucasian female in no acute distress.  Accompanied by her father ?HEENT:  Normocephalic and atraumatic. Conjunctivae are normal. No scleral icterus. ?Neck supple.  ?Cardiovascular: Normal rate, regular rhythm.  ?Pulmonary/chest: Effort normal and breath sounds normal. No wheezing, rales or rhonchi. ?Abdominal: Soft, nondistended, tenderness to palpation in the epigastrium, no rigidity or guarding. Bowel sounds active throughout. There are no masses palpable. No hepatomegaly. ?Extremities: no edema ?Neurological: Alert and oriented to person place and time. ?Skin: Skin is warm and dry. No rashes noted. ?Psychiatric: Normal mood and affect. Behavior is normal. ? ?CBC ?   ?Component Value Date/Time  ? WBC 5.8 03/08/2021 1231  ? RBC 4.64 03/08/2021 1231  ? HGB 14.1 03/08/2021 1231  ? HCT 41.3 03/08/2021 1231  ? PLT 197 03/08/2021 1231  ? MCV 89.0 03/08/2021 1231  ? MCH 30.4 03/08/2021 1231  ? MCHC 34.1 03/08/2021 1231  ? RDW 12.5 03/08/2021 1231  ? LYMPHSABS 2.0 03/08/2021 1231  ? MONOABS 0.5 03/08/2021 1231  ? EOSABS 0.1 03/08/2021 1231  ? BASOSABS 0.0 03/08/2021 1231  ? ? ?CMP  ?   ?Component Value Date/Time  ? NA 137 03/08/2021 1231  ? K 3.9 03/08/2021 1231  ? CL 103 03/08/2021 1231  ? CO2 24 03/08/2021 1231  ? GLUCOSE 92 03/08/2021 1231  ? BUN 12 03/08/2021 1231  ? CREATININE 0.85 03/08/2021 1231  ? CALCIUM 9.1 03/08/2021 1231  ? PROT 7.2 03/08/2021 1231  ? ALBUMIN 3.9 03/08/2021 1231  ? AST 14 (L) 03/08/2021 1231  ? ALT 18 03/08/2021 1231  ? ALKPHOS 50 03/08/2021 1231  ? BILITOT 1.0 03/08/2021 1231  ? GFRNONAA >60 03/08/2021 1231  ? GFRAA >60 06/13/2019 0430  ? ? ? ?ASSESSMENT AND PLAN: ?26 year old female admitted with gallstone pancreatitis in July status post cholecystectomy with IOC, with continued epigastric pain episodes occurring frequently, lasting 30 to 60 minutes, with at least 1 episode associated with elevated liver enzymes.  The patient does have evidence of steatosis on ultrasound which could account for some of her aminotransferase elevation, however her most recent labs  in our system were normal in July.  The ultrasound did not demonstrate biliary dilation, although the images were not available for review in the specifics of the biliary tree were not specifically mentioned in the report.  Differential includes retained stone/sludge, sphincter of Oddi dysfunction versus luminal etiologies or functional abdominal pain. ?We will get MRCP to assess for presence of small stones or sludge, as well as biliary dilation.  We will recheck liver enzymes today to see if they are within normal limits, and then patient has been advised to come to our lab the next time she has an attack of pain and we will recheck her liver enzymes again.  If the MRCP is unremarkable, and she has recurrent transient liver enzyme  elevations in the setting of abdominal pain, this would meet criteria for sphincter of Oddi dysfunction and I would recommend she be considered for sphincterotomy. ?In the meantime, will prescribe hyoscyamine as needed for her attacks of abdominal pain. ? ?Episodic epigastric pain, status post cholecystectomy for gallstone pancreatitis ?-MRCP to look for retained stone/sludge ?-LAEs today and repeat with next attack ?-Hyocyamine for abdominal pain ?-Consider EGD to exclude luminal etiologies if liver enzymes and MRCP unremarkable. ? ?Zelma Snead E. Candis Schatz, MD ?[facility] ? ?CC:  Mateo Flow, MD ? ?

## 2021-10-17 NOTE — Patient Instructions (Signed)
If you are age 27 or older, your body mass index should be between 23-30. Your Body mass index is 41.11 kg/m?Marland Kitchen If this is out of the aforementioned range listed, please consider follow up with your Primary Care Provider. ? ?If you are age 27 or younger, your body mass index should be between 19-25. Your Body mass index is 41.11 kg/m?Marland Kitchen If this is out of the aformentioned range listed, please consider follow up with your Primary Care Provider.  ? ?We have sent the following medications to your pharmacy for you to pick up at your convenience: Hyoscyamine SL 0.125 mg every 8 hours as needed. ? ? ?Your provider has requested that you go to the basement level for lab work before leaving today. Press "B" on the elevator. The lab is located at the first door on the left as you exit the elevator. ? ?You have been scheduled for an MRI at [facility] on 10/23/21. Your appointment time is 4pm. Please arrive to admitting (at main entrance of the hospital) 15 minutes prior to your appointment time for registration purposes. Please make certain not to have anything to eat or drink 6 hours prior to your test. In addition, if you have any metal in your body, have a pacemaker or defibrillator, please be sure to let your ordering physician know. This test typically takes 45 minutes to 1 hour to complete. Should you need to reschedule, please call [phone] to do so. ? ?The [facility] providers would like to encourage you to use [facility] to communicate with providers for non-urgent requests or questions.  Due to long hold times on the telephone, sending your provider a message by [facility] may be a faster and more efficient way to get a response.  Please allow 48 business hours for a response.  Please remember that this is for non-urgent requests.  ? ?It was a pleasure to see you today! ? ?Thank you for trusting me with your gastrointestinal care!   ? ?Scott E.Tomasa Rand, MD  ? ?

## 2021-10-20 DIAGNOSIS — N765 Ulceration of vagina: Secondary | ICD-10-CM | POA: Diagnosis not present

## 2021-10-20 DIAGNOSIS — N898 Other specified noninflammatory disorders of vagina: Secondary | ICD-10-CM | POA: Diagnosis not present

## 2021-10-23 ENCOUNTER — Ambulatory Visit (HOSPITAL_COMMUNITY): Payer: BC Managed Care – PPO

## 2021-10-30 ENCOUNTER — Other Ambulatory Visit: Payer: Self-pay | Admitting: Gastroenterology

## 2021-10-30 ENCOUNTER — Telehealth: Payer: Self-pay | Admitting: Gastroenterology

## 2021-10-30 ENCOUNTER — Ambulatory Visit (HOSPITAL_COMMUNITY): Admission: RE | Admit: 2021-10-30 | Payer: BC Managed Care – PPO | Source: Ambulatory Visit

## 2021-10-30 DIAGNOSIS — R748 Abnormal levels of other serum enzymes: Secondary | ICD-10-CM

## 2021-10-30 DIAGNOSIS — R1084 Generalized abdominal pain: Secondary | ICD-10-CM

## 2021-10-30 DIAGNOSIS — F411 Generalized anxiety disorder: Secondary | ICD-10-CM | POA: Diagnosis not present

## 2021-10-30 DIAGNOSIS — F3132 Bipolar disorder, current episode depressed, moderate: Secondary | ICD-10-CM | POA: Diagnosis not present

## 2021-10-30 DIAGNOSIS — G2571 Drug induced akathisia: Secondary | ICD-10-CM | POA: Diagnosis not present

## 2021-10-30 DIAGNOSIS — F4310 Post-traumatic stress disorder, unspecified: Secondary | ICD-10-CM | POA: Diagnosis not present

## 2021-10-30 NOTE — Telephone Encounter (Signed)
Patient called to cancel for today's MRI said it would be too expensive to do. ?

## 2021-10-30 NOTE — Telephone Encounter (Signed)
FYI

## 2021-11-12 DIAGNOSIS — M5416 Radiculopathy, lumbar region: Secondary | ICD-10-CM | POA: Diagnosis not present

## 2021-11-12 DIAGNOSIS — M545 Low back pain, unspecified: Secondary | ICD-10-CM | POA: Diagnosis not present

## 2021-11-15 DIAGNOSIS — F4310 Post-traumatic stress disorder, unspecified: Secondary | ICD-10-CM | POA: Diagnosis not present

## 2021-11-15 DIAGNOSIS — F3132 Bipolar disorder, current episode depressed, moderate: Secondary | ICD-10-CM | POA: Diagnosis not present

## 2021-11-15 DIAGNOSIS — F411 Generalized anxiety disorder: Secondary | ICD-10-CM | POA: Diagnosis not present

## 2021-11-15 DIAGNOSIS — G2571 Drug induced akathisia: Secondary | ICD-10-CM | POA: Diagnosis not present

## 2021-11-29 DIAGNOSIS — G2571 Drug induced akathisia: Secondary | ICD-10-CM | POA: Diagnosis not present

## 2021-11-29 DIAGNOSIS — F4310 Post-traumatic stress disorder, unspecified: Secondary | ICD-10-CM | POA: Diagnosis not present

## 2021-11-29 DIAGNOSIS — F3132 Bipolar disorder, current episode depressed, moderate: Secondary | ICD-10-CM | POA: Diagnosis not present

## 2021-11-29 DIAGNOSIS — F411 Generalized anxiety disorder: Secondary | ICD-10-CM | POA: Diagnosis not present

## 2021-12-03 DIAGNOSIS — B3731 Acute candidiasis of vulva and vagina: Secondary | ICD-10-CM | POA: Diagnosis not present

## 2021-12-09 DIAGNOSIS — F3132 Bipolar disorder, current episode depressed, moderate: Secondary | ICD-10-CM | POA: Diagnosis not present

## 2021-12-13 DIAGNOSIS — F411 Generalized anxiety disorder: Secondary | ICD-10-CM | POA: Diagnosis not present

## 2021-12-13 DIAGNOSIS — F4310 Post-traumatic stress disorder, unspecified: Secondary | ICD-10-CM | POA: Diagnosis not present

## 2021-12-13 DIAGNOSIS — G2571 Drug induced akathisia: Secondary | ICD-10-CM | POA: Diagnosis not present

## 2021-12-13 DIAGNOSIS — F3132 Bipolar disorder, current episode depressed, moderate: Secondary | ICD-10-CM | POA: Diagnosis not present

## 2021-12-20 DIAGNOSIS — Z6841 Body Mass Index (BMI) 40.0 and over, adult: Secondary | ICD-10-CM | POA: Diagnosis not present

## 2022-01-09 DIAGNOSIS — N763 Subacute and chronic vulvitis: Secondary | ICD-10-CM | POA: Diagnosis not present

## 2022-01-09 DIAGNOSIS — Z6841 Body Mass Index (BMI) 40.0 and over, adult: Secondary | ICD-10-CM | POA: Diagnosis not present

## 2022-01-23 DIAGNOSIS — G2571 Drug induced akathisia: Secondary | ICD-10-CM | POA: Diagnosis not present

## 2022-01-23 DIAGNOSIS — F411 Generalized anxiety disorder: Secondary | ICD-10-CM | POA: Diagnosis not present

## 2022-01-23 DIAGNOSIS — F3132 Bipolar disorder, current episode depressed, moderate: Secondary | ICD-10-CM | POA: Diagnosis not present

## 2022-01-23 DIAGNOSIS — F4310 Post-traumatic stress disorder, unspecified: Secondary | ICD-10-CM | POA: Diagnosis not present

## 2022-02-15 IMAGING — CT CT ABD-PELV W/ CM
2 of 5 series · 16 of 46 positions shown, 18 images · IV contrast (omnipaque)
Comparison: Included portions from chest CT 02/07/2021 abdominal CT
08/14/2020. ultrasound 02/10/2021

CLINICAL DATA: Nausea and vomiting. Epigastric pain. Recent
admission for pancreatitis. Recent cholecystectomy.

EXAM:
CT ABDOMEN AND PELVIS WITH CONTRAST
TECHNIQUE: Multidetector CT imaging of the abdomen and pelvis was performed
using the standard protocol following bolus administration of
intravenous contrast.
CONTRAST:  100mL OMNIPAQUE IOHEXOL 350 MG/ML SOLN

[Series 7: thins · axial · 0.98mm/px · z∈[+648,+1138]mm · 13 of 534 slices shown, 15 images]
[im 22/534  soft-tissue]
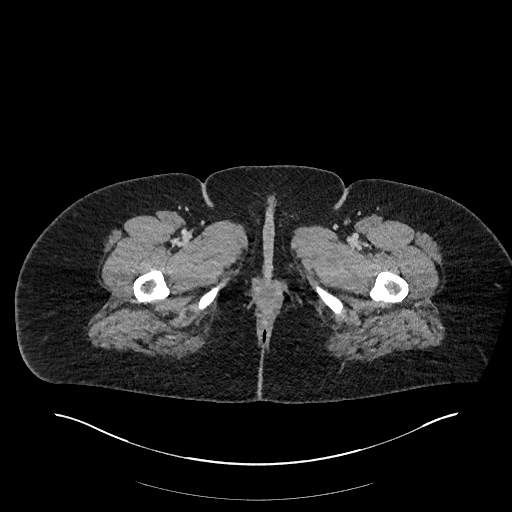
[im 22/534  bone]
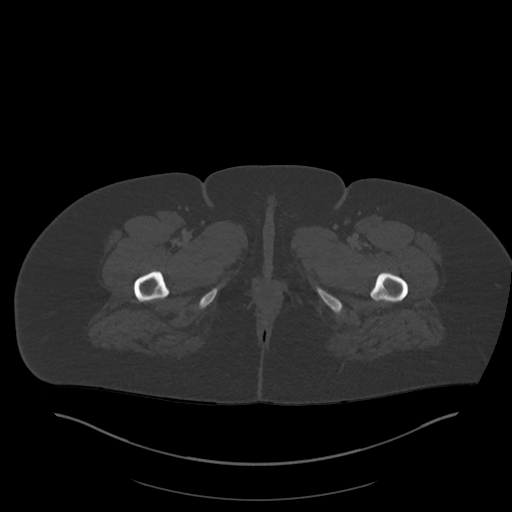
[im 64/534  soft-tissue]
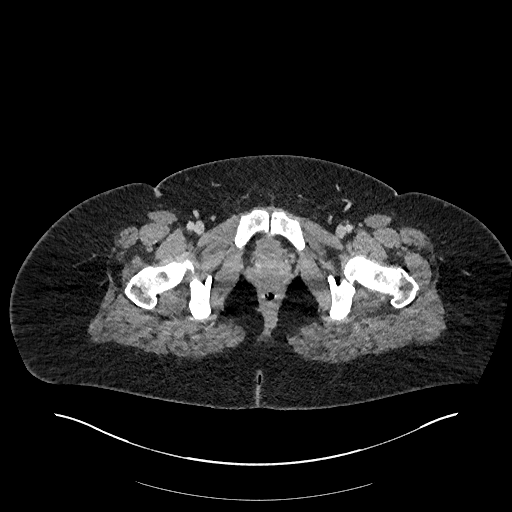
[im 107/534  soft-tissue]
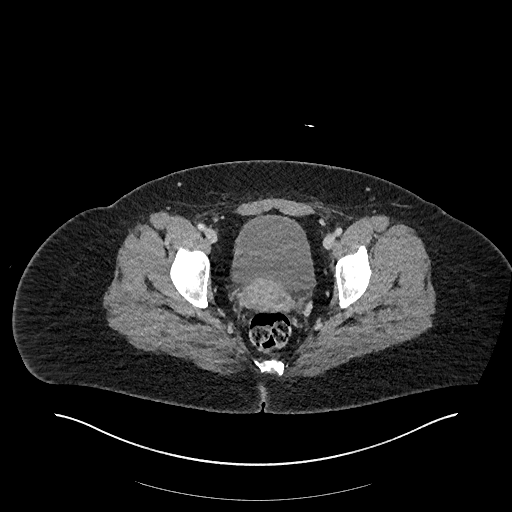
[im 150/534  soft-tissue]
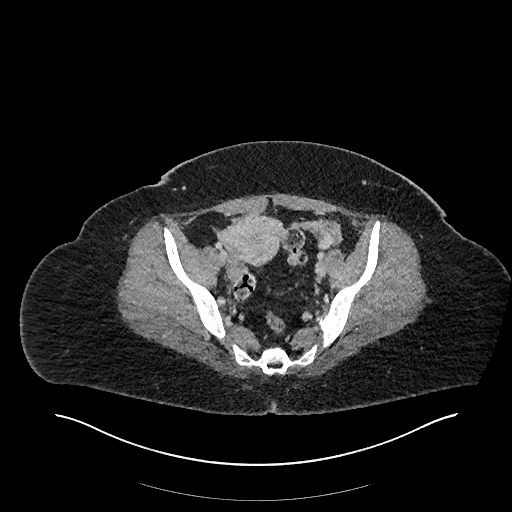
[im 192/534  soft-tissue]
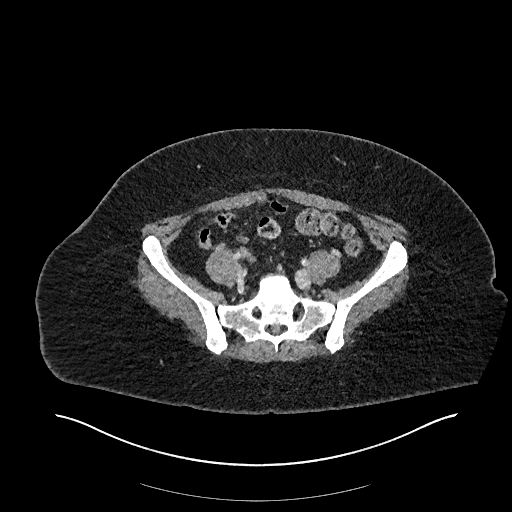
[im 235/534  soft-tissue]
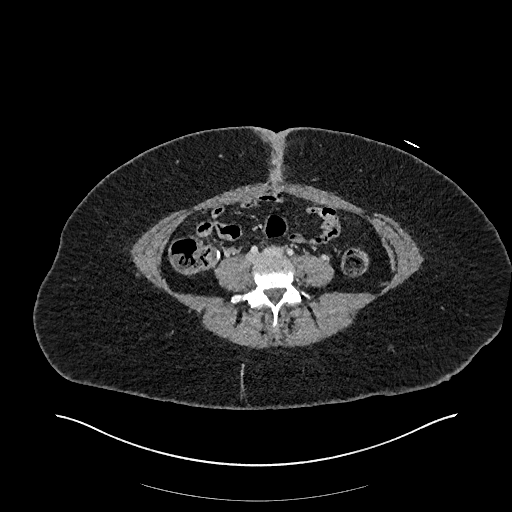
[im 278/534  soft-tissue]
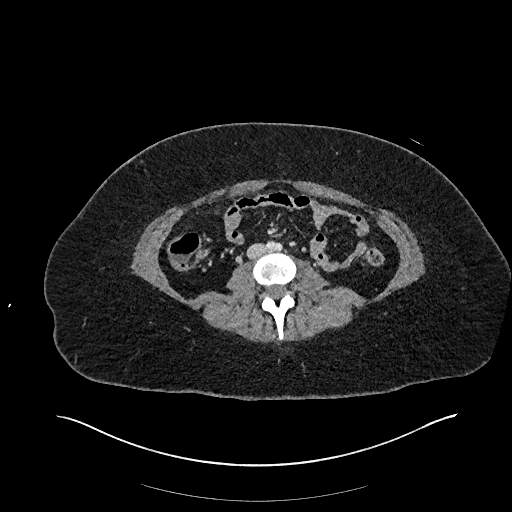
[im 299/534  soft-tissue]
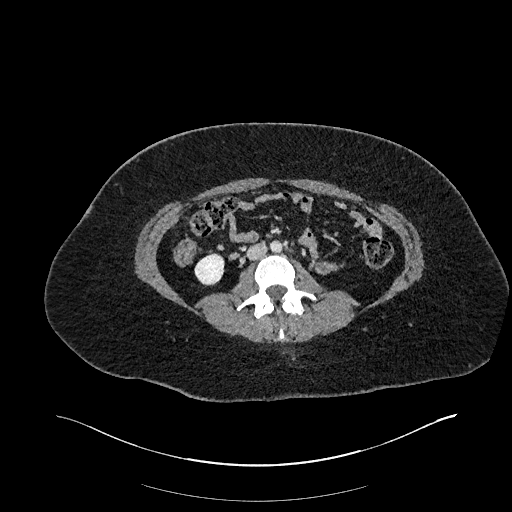
[im 342/534  soft-tissue]
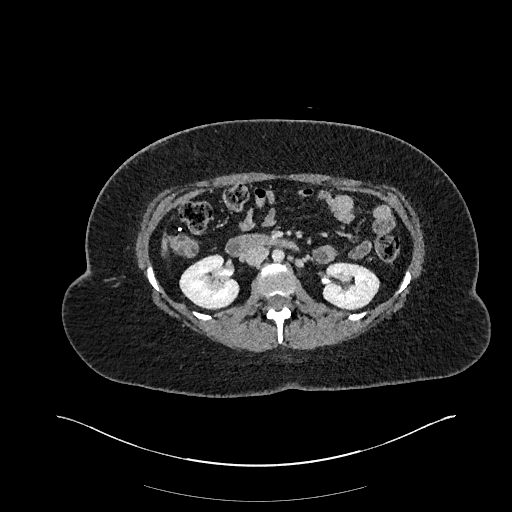
[im 342/534  bone]
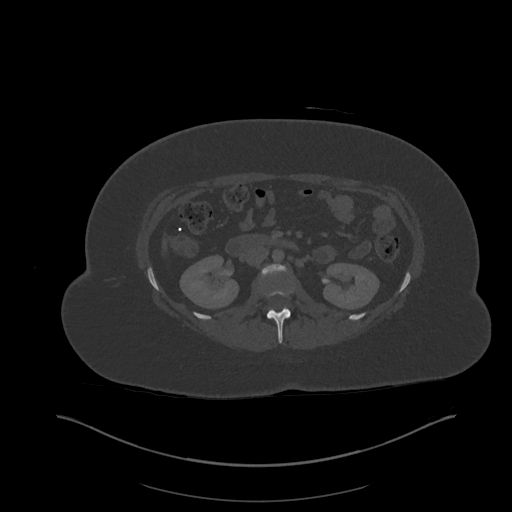
[im 384/534  soft-tissue]
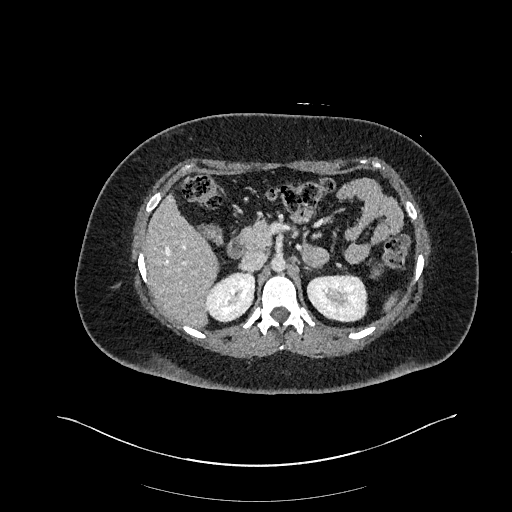
[im 427/534  soft-tissue]
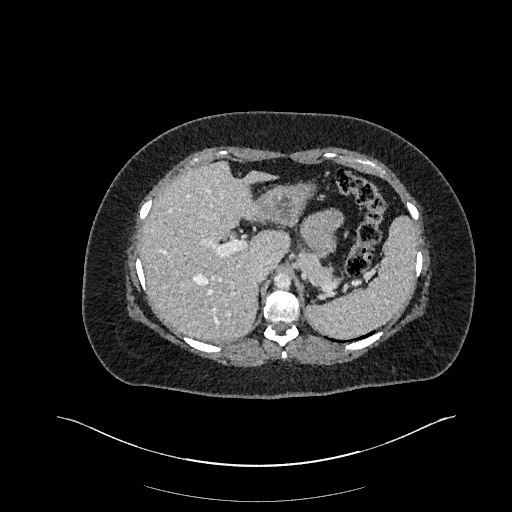
[im 470/534  soft-tissue]
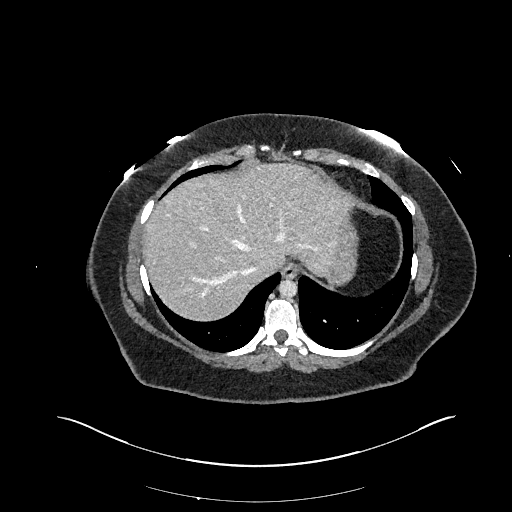
[im 512/534  soft-tissue]
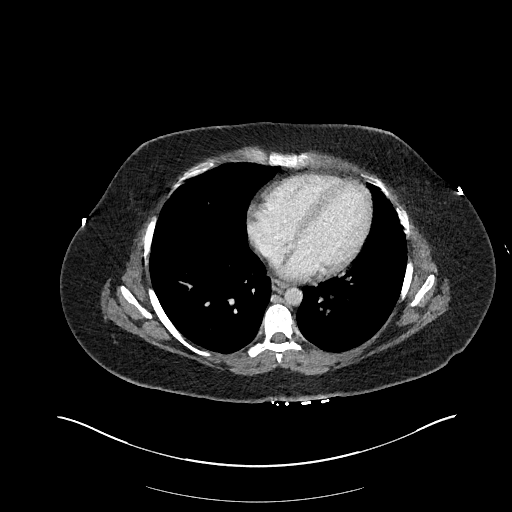

[Series 8: coronals · coronal · 0.97mm/px · 3 of 103 slices shown]
[im 35/103  soft-tissue]
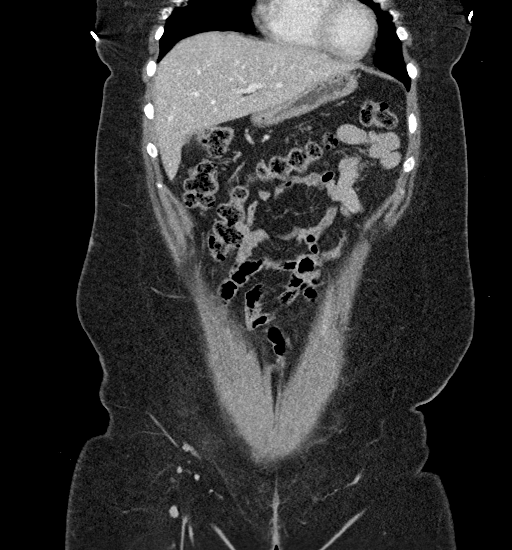
[im 46/103  soft-tissue]
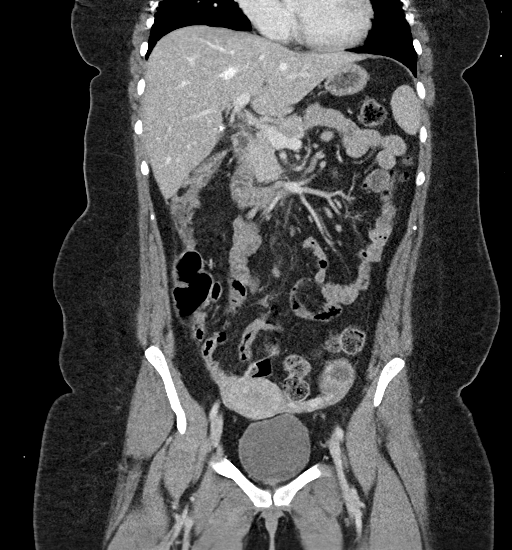
[im 57/103  soft-tissue]
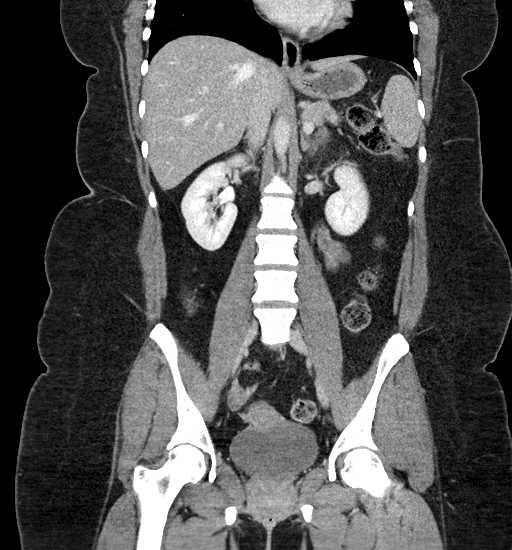

[16 of 46 positions shown; findings below may reference images not displayed]

FINDINGS: Lower chest: Assessed on concurrent chest CT, reported separately.

Hepatobiliary: There is no definite CT correlate to the hyperechoic
lesion on recent ultrasound. Post cholecystectomy. Minimal fluid in
the cholecystectomy bed is typical of postsurgical change. No
suspicious collection. There is no biliary dilatation.

Pancreas: Previous peripancreatic fat stranding has resolved. No
pancreatic ductal dilatation. No evidence of focal pancreatic mass.

Spleen: Normal in size without focal abnormality.

Adrenals/Urinary Tract: Normal adrenal glands. No hydronephrosis or
perinephric edema. Homogeneous renal enhancement. Unremarkable
urinary bladder.

Stomach/Bowel: The stomach is partially distended, unremarkable.
Normal positioning of the duodenum and ligament of Treitz. No small
bowel obstruction, inflammation or wall thickening. Appendectomy.
Small to moderate volume of colonic stool. No colonic wall
thickening or inflammatory change.

Vascular/Lymphatic: Normal caliber abdominal aorta. Circumaortic
left renal vein. Patent portal vein. Patent splenic vein. No
enlarged lymph nodes in the abdomen or pelvis.

Reproductive: Unremarkable CT appearance of the uterus and right
ovary. There is a 3.4 cm left ovarian cyst. This appears similar to
prior exam.

Other: No free air, focal fluid collection or free fluid. No
subcutaneous collection or inflammation. No abdominal wall hernia.

Musculoskeletal: There are no acute or suspicious osseous
abnormalities.
IMPRESSION: 1. Post recent cholecystectomy with minimal fluid in the
cholecystectomy bed, typical of postsurgical change. No abscess or
biloma. No biliary dilatation.
2. No other acute findings.  No explanation for abdominal pain.
3. Left ovarian cyst measuring 3.4 cm, similar to prior exam. No
follow-up imaging recommended. Note: This recommendation does not
apply to premenarchal patients and to those with increased risk
(genetic, family history, elevated tumor markers or other high-risk
factors) of ovarian cancer. Reference: JACR [DATE]):[id number]

## 2022-02-15 IMAGING — CT CT ANGIO CHEST
2 of 6 series · 18 of 46 positions shown · IV contrast (omnipaque)
Comparison: Chest CTA 02/07/2021, additional priors

CLINICAL DATA: PE suspected, high prob

Patient reports epigastric pain radiating to the back. Recent
admission for pancreatitis. Recent cholecystectomy
EXAM:
CT ANGIOGRAPHY CHEST WITH CONTRAST
TECHNIQUE: Multidetector CT imaging of the chest was performed using the
standard protocol during bolus administration of intravenous
contrast. Multiplanar CT image reconstructions and MIPs were
obtained to evaluate the vascular anatomy.
CONTRAST:  100mL OMNIPAQUE IOHEXOL 350 MG/ML SOLN

[Series 4: thins · axial · 0.80mm/px · z∈[+1045,+1270]mm · 16 of 247 slices shown]
[im 11/247  lung]
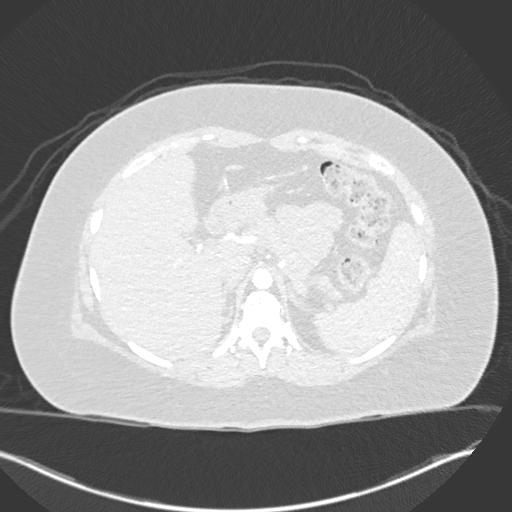
[im 33/247  soft-tissue]
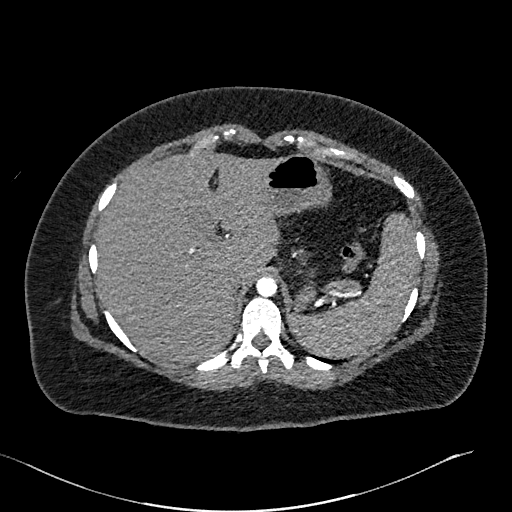
[im 43/247  lung]
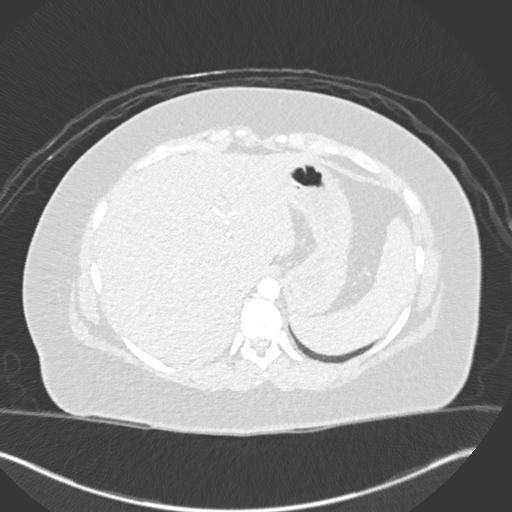
[im 54/247  soft-tissue]
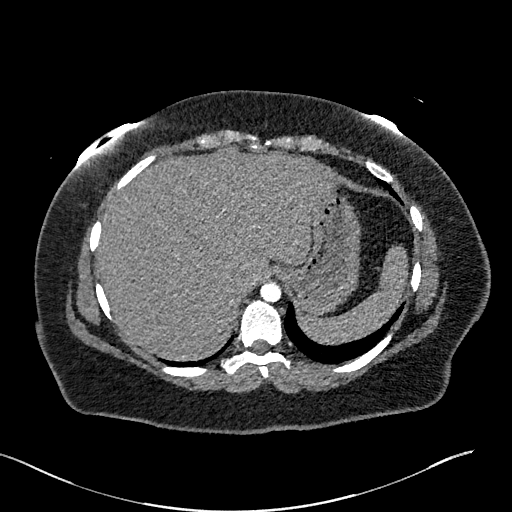
[im 75/247  lung]
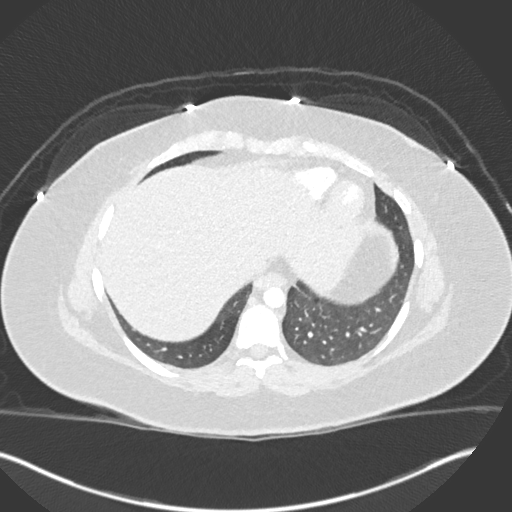
[im 86/247  soft-tissue]
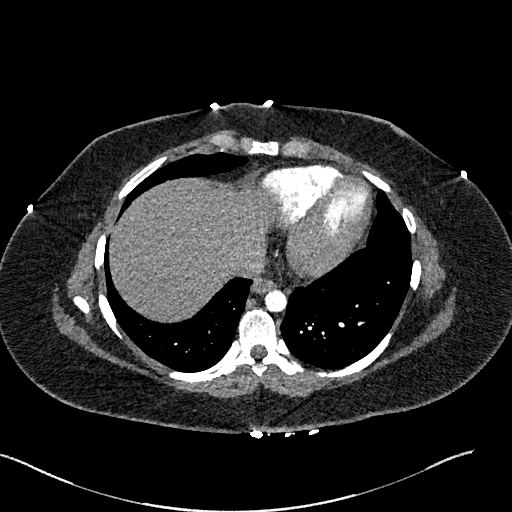
[im 97/247  lung]
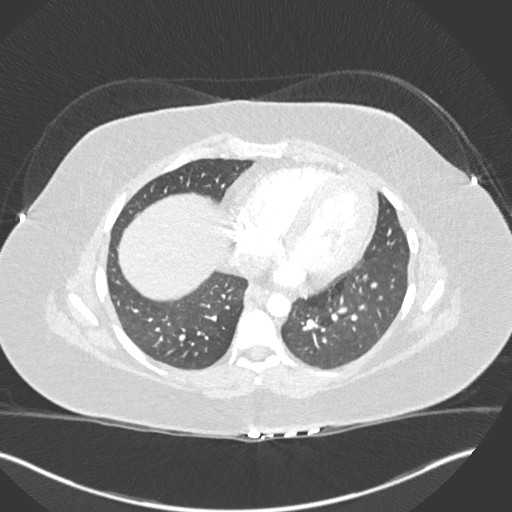
[im 118/247  soft-tissue]
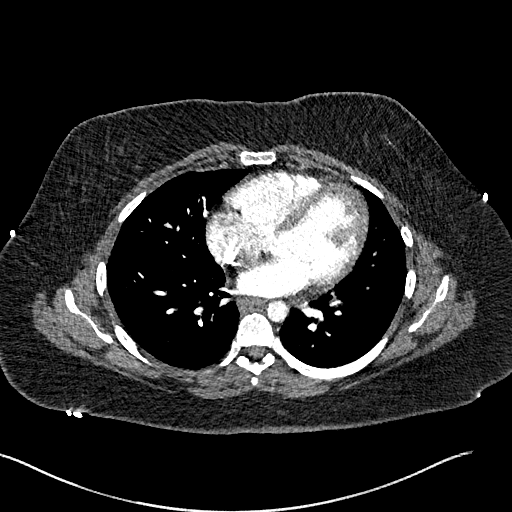
[im 129/247  lung]
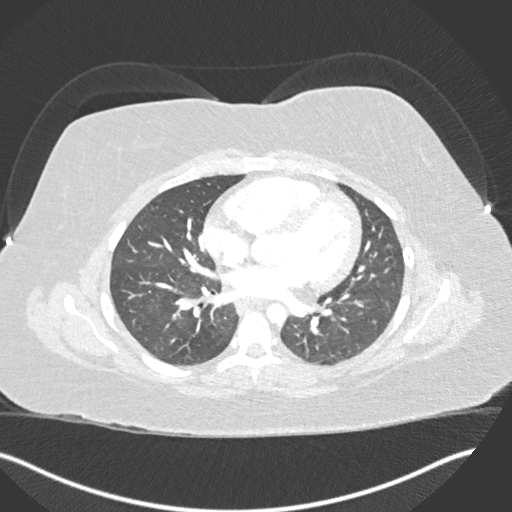
[im 150/247  soft-tissue]
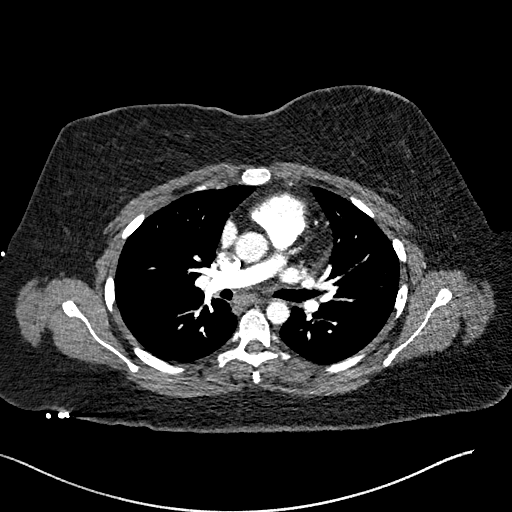
[im 161/247  lung]
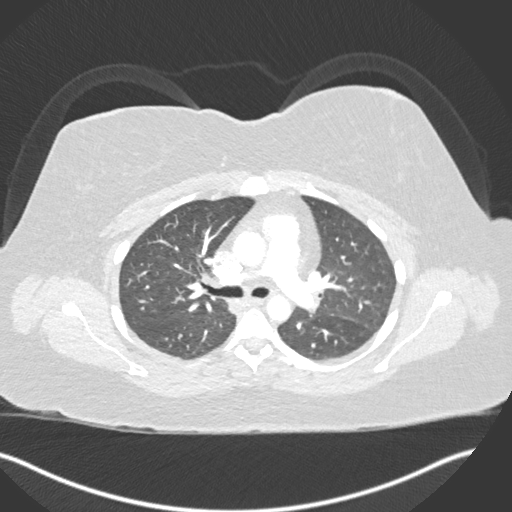
[im 172/247  soft-tissue]
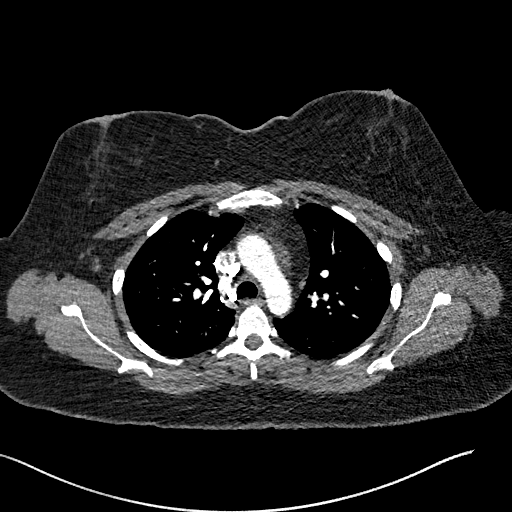
[im 193/247  lung]
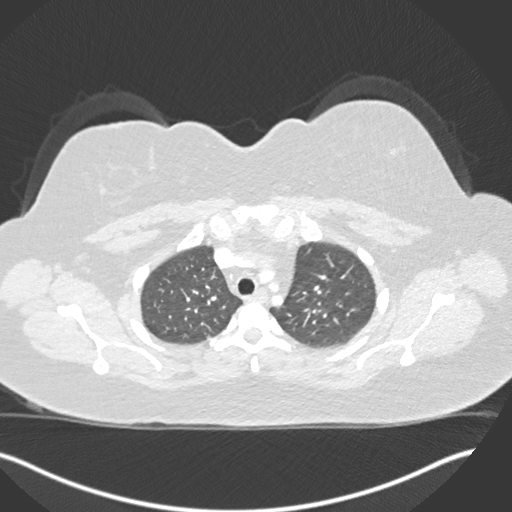
[im 204/247  soft-tissue]
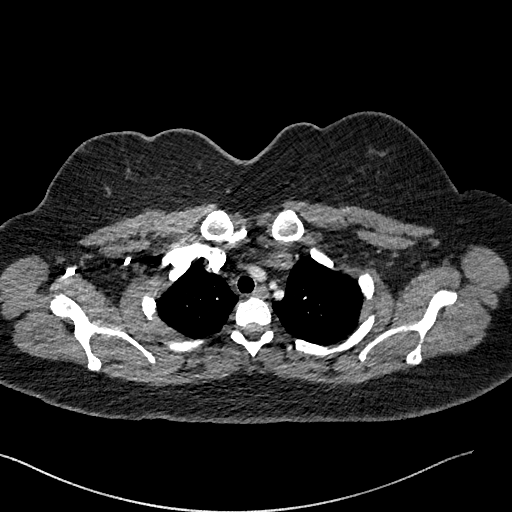
[im 214/247  lung]
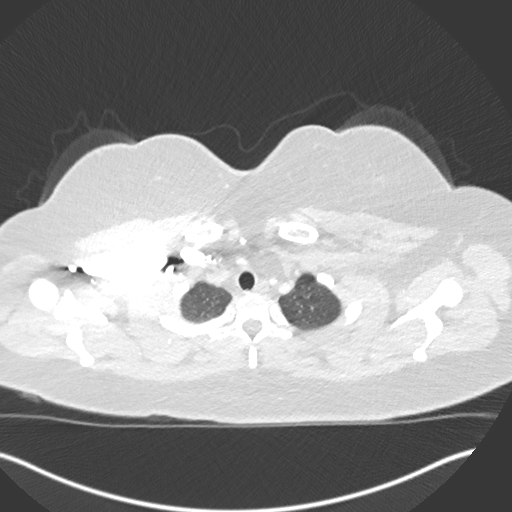
[im 236/247  soft-tissue]
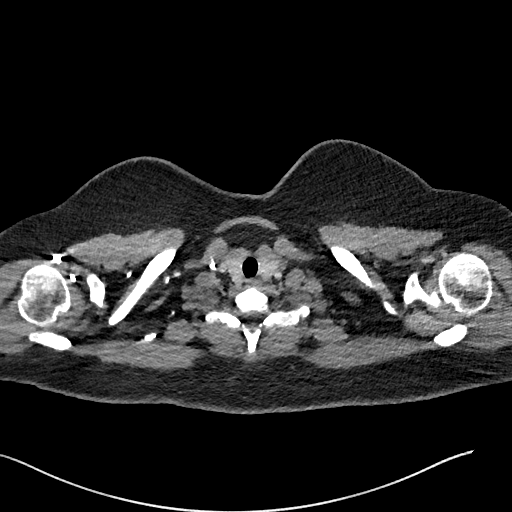

[Series 6: coronal mpr · coronal · 0.51mm/px · 2 of 95 slices shown]
[im 32/95  soft-tissue]
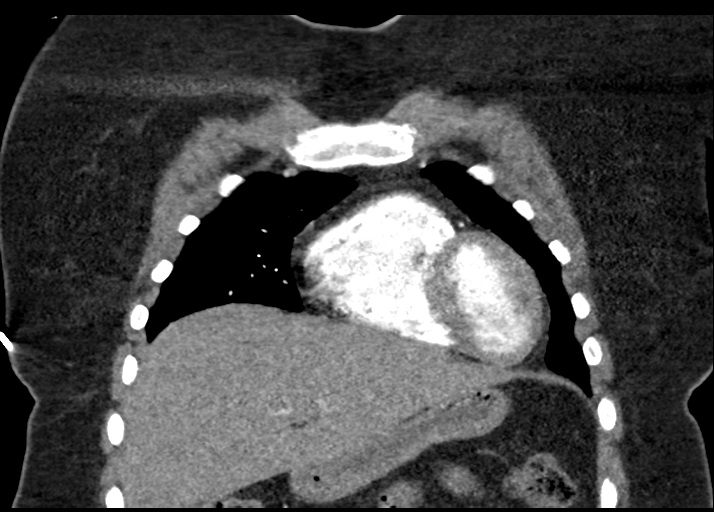
[im 63/95  soft-tissue]
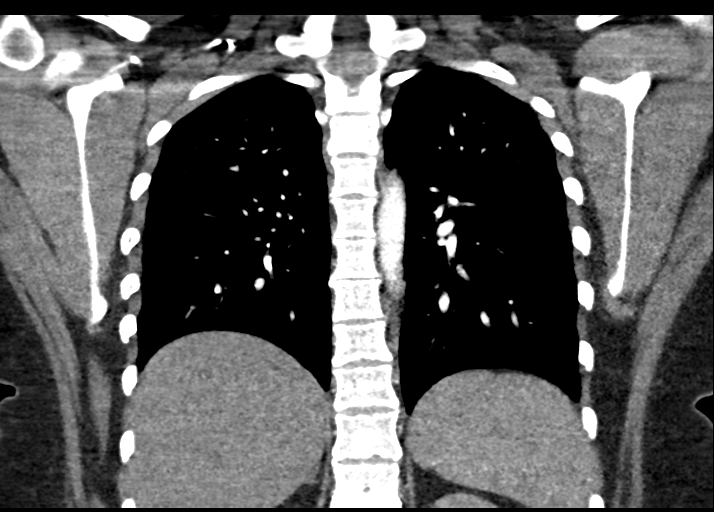

[18 of 46 positions shown; findings below may reference images not displayed]

FINDINGS: Cardiovascular: There are no filling defects within the pulmonary
arteries to suggest pulmonary embolus. Normal caliber thoracic
aorta. Normal heart size. No pericardial effusion.

Mediastinum/Nodes: No enlarged mediastinal or hilar lymph nodes. No
thyroid nodule. No esophageal wall thickening.

Lungs/Pleura: No acute airspace disease. No pleural fluid. No
findings of pulmonary edema. The trachea and central bronchi are
patent. No pneumothorax.

Upper Abdomen: Assessed on concurrent abdominal CT, reported
separately

Musculoskeletal: There are no acute or suspicious osseous
abnormalities.

Review of the MIP images confirms the above findings.
IMPRESSION: No pulmonary embolus or acute intrathoracic abnormality.

## 2022-03-07 DIAGNOSIS — M79672 Pain in left foot: Secondary | ICD-10-CM | POA: Diagnosis not present

## 2022-03-07 DIAGNOSIS — W109XXA Fall (on) (from) unspecified stairs and steps, initial encounter: Secondary | ICD-10-CM | POA: Diagnosis not present

## 2022-03-07 DIAGNOSIS — S93602A Unspecified sprain of left foot, initial encounter: Secondary | ICD-10-CM | POA: Diagnosis not present

## 2022-04-16 DIAGNOSIS — Z6841 Body Mass Index (BMI) 40.0 and over, adult: Secondary | ICD-10-CM | POA: Diagnosis not present

## 2022-04-16 DIAGNOSIS — R1013 Epigastric pain: Secondary | ICD-10-CM | POA: Diagnosis not present

## 2022-06-16 ENCOUNTER — Ambulatory Visit: Payer: Self-pay | Admitting: Licensed Clinical Social Worker

## 2022-06-16 NOTE — Patient Outreach (Signed)
  Care Coordination   06/16/2022 Name: Marissa Andrade MRN: [id number] DOB: 11-Aug-1996   Care Coordination Outreach Attempts:  An unsuccessful telephone outreach was attempted today to offer the patient information about available care coordination services as a benefit of their health plan.   Follow Up Plan:  Additional outreach attempts will be made to offer the patient care coordination information and services.   Encounter Outcome:  No Answer  Care Coordination Interventions Activated:  No   Care Coordination Interventions:  No, not indicated    Milus Height, BSW, MSW, [place]  Social Worker [facility] Care Management  [phone]

## 2022-06-19 ENCOUNTER — Telehealth: Payer: Self-pay

## 2022-06-19 NOTE — Patient Outreach (Signed)
  Care Coordination   06/19/2022 Name: Marissa Andrade MRN: [id number] DOB: 1995-11-23   Care Coordination Outreach Attempts:  A second unsuccessful outreach was attempted today to offer the patient with information about available care coordination services as a benefit of their health plan.     Follow Up Plan:  Additional outreach attempts will be made to offer the patient care coordination information and services.   Encounter Outcome:  No Answer  Care Coordination Interventions Activated:  No   Care Coordination Interventions:  No, not indicated    Rowe Pavy, RN, BSN, CEN [facility] [phone]

## 2022-06-20 ENCOUNTER — Telehealth: Payer: Self-pay

## 2022-06-20 NOTE — Patient Outreach (Signed)
  Care Coordination   06/20/2022 Name: Marissa Andrade MRN: [id number] DOB: 04-28-1996   Care Coordination Outreach Attempts:  A third unsuccessful outreach was attempted today to offer the patient with information about available care coordination services as a benefit of their health plan.   Follow Up Plan:  No further outreach attempts will be made at this time. We have been unable to contact the patient to offer or enroll patient in care coordination services  Encounter Outcome:  No Answer  Care Coordination Interventions Activated:  No   Care Coordination Interventions:  No, not indicated    .acs

## 2022-07-28 ENCOUNTER — Other Ambulatory Visit (INDEPENDENT_AMBULATORY_CARE_PROVIDER_SITE_OTHER): Payer: BC Managed Care – PPO

## 2022-07-28 ENCOUNTER — Encounter: Payer: Self-pay | Admitting: Gastroenterology

## 2022-07-28 ENCOUNTER — Ambulatory Visit: Payer: BC Managed Care – PPO | Admitting: Gastroenterology

## 2022-07-28 ENCOUNTER — Other Ambulatory Visit: Payer: Self-pay | Admitting: Gastroenterology

## 2022-07-28 VITALS — BP 138/80 | HR 76 | Ht 64.0 in | Wt 247.0 lb

## 2022-07-28 DIAGNOSIS — R1084 Generalized abdominal pain: Secondary | ICD-10-CM | POA: Diagnosis not present

## 2022-07-28 DIAGNOSIS — R112 Nausea with vomiting, unspecified: Secondary | ICD-10-CM

## 2022-07-28 DIAGNOSIS — R748 Abnormal levels of other serum enzymes: Secondary | ICD-10-CM | POA: Diagnosis not present

## 2022-07-28 DIAGNOSIS — Z9049 Acquired absence of other specified parts of digestive tract: Secondary | ICD-10-CM

## 2022-07-28 DIAGNOSIS — R1010 Upper abdominal pain, unspecified: Secondary | ICD-10-CM

## 2022-07-28 LAB — HEPATIC FUNCTION PANEL
ALT: 14 U/L (ref 0–35)
AST: 13 U/L (ref 0–37)
Albumin: 4.4 g/dL (ref 3.5–5.2)
Alkaline Phosphatase: 60 U/L (ref 39–117)
Bilirubin, Direct: 0 mg/dL (ref 0.0–0.3)
Total Bilirubin: 0.4 mg/dL (ref 0.2–1.2)
Total Protein: 7.5 g/dL (ref 6.0–8.3)

## 2022-07-28 LAB — LIPASE: Lipase: 14 U/L (ref 11.0–59.0)

## 2022-07-28 LAB — C-REACTIVE PROTEIN: CRP: <1 mg/dL (ref 0.5–20.0)

## 2022-07-28 MED ORDER — AMITRIPTYLINE HCL 10 MG PO TABS: 10.0000 mg | ORAL_TABLET | Freq: Every day | ORAL | 1 refills | Status: DC

## 2022-07-28 MED ORDER — HYOSCYAMINE SULFATE 0.125 MG SL SUBL: 0.1250 mg | SUBLINGUAL_TABLET | Freq: Four times a day (QID) | SUBLINGUAL | 3 refills | Status: DC | PRN

## 2022-07-28 NOTE — Patient Instructions (Signed)
_______________________________________________________  If you are age 27 or older, your body mass index should be between 23-30. Your Body mass index is 42.4 kg/m. If this is out of the aforementioned range listed, please consider follow up with your Primary Care Provider.  If you are age 69 or younger, your body mass index should be between 19-25. Your Body mass index is 42.4 kg/m. If this is out of the aformentioned range listed, please consider follow up with your Primary Care Provider.   Your provider has requested that you go to the basement level for lab work before leaving today. Press "B" on the elevator. The lab is located at the first door on the left as you exit the elevator.  We have sent the following medications to your pharmacy for you to pick up at your convenience: Elavil 10 mg , Hyoscyomine 0.125 mg  You will be contacted by [facility] Scheduling in the next 2 days to arrange a Ultrasound.  The number on your caller ID will be [phone], please answer when they call.  If you have not heard from them in 2 days please call [phone] to schedule.     The [facility] providers would like to encourage you to use [facility] to communicate with providers for non-urgent requests or questions.  Due to long hold times on the telephone, sending your provider a message by [facility] may be a faster and more efficient way to get a response.  Please allow 48 business hours for a response.  Please remember that this is for non-urgent requests.   It was a pleasure to see you today!  Thank you for trusting me with your gastrointestinal care!    Scott E.Tomasa Rand, MD

## 2022-07-28 NOTE — Progress Notes (Signed)
HPI : Marissa Andrade is a very pleasant 26 year old female with a history of anxiety who initially saw in September 2022 for persistent abdominal pain following an admission for gallstone pancreatitis.  At her last visit in May, I had recommended that she get labs drawn during one of her pain episodes to see if her liver enzymes are elevated, and also recommended MRCP to assess for common bile duct stones.  The patient's insurance did not cover the MRI, so she did not have the study done. Today, the patient reports that her symptoms are probably worse than they were back in May.  She is having episodes of abdominal pain with nausea and retching 2 to 3 days/week.  Each episode lasts anywhere from 30 minutes up to 2 hours.  She has not noticed a pattern or trigger for these episodes.  They are not typically associated with eating.  They frequently awaken her from sleep.  Hyoscyamine does help with the symptoms, but it takes a while to work.  Her last episode was this morning around 7:30.  It was mild compared to some further episodes, and lasted about 30 minutes. See HPI from previous note for description of her symptoms. She reports normal appetite.  No unintentional weight loss.    October 17, 2021 HPI : Marissa Andrade is a very pleasant 26 year old female with a history of anxiety who was admitted for gallstone pancreatitis in July 2022.  She underwent a cholecystectomy during that admission.  She is referred to [place] by Dr.Jaber Humphrey Rolls because she has been having persistent episodic abdominal pain ever since her admission.  She has been having episodes of pain occurring about every other day and frequency.  The pain starts in her upper abdomen and radiates to her back.  It is associated with nausea but no vomiting.  Sometimes the pain is severe, up to 8/10.  She states that the pain feels very similar to her pancreatitis pain, but is not quite as severe.  She typically has the urge to defecate with these  episodes, and frequently passes liquid stool.  Otherwise, she denies any other symptoms to go along with the pain.  Pain typically last between 30 and 60 minutes.  She has not identified any triggers or patterns to the pain.  It is not necessarily associated with eating.  She has noted that its more often in the morning as opposed to the evening. She follows a regular diet, other than avoiding fatty foods since her cholecystectomy. Her weight has been stable.  She has regular bowel movements, except for when she has loose stools during the pain episodes.  She denies any GI symptoms in between these isolated episodes of pain. Prior to her gallstone pancreatitis, she denied any chronic GI symptoms.  Patient states that she recently saw her primary doctor during 1 of these episodes of pain, and her liver enzymes were checked and were elevated (ALT 200, T. bili, alk phos unremarkable).  She had a right upper quadrant ultrasound which showed fatty change in the liver but no other abnormalities (patient has printed out report from outside facility).  The diameter of the common bile duct was not specifically mentioned in the report.  EGD April 23, 2021 Antral erythema, erythematous bulb Biopsies consistent with peptic duodenitis and mild chronic gastritis without H. pylori. Past Medical History:  Diagnosis Date   Anxiety    Blood transfusion without reported diagnosis    Depression    PPD   History of  gestational hypertension    Tachycardia 07/05/2017   postpartum, spent 1 week in hospital, no further diagnosis     Past Surgical History:  Procedure Laterality Date   APPENDECTOMY     BREAST SURGERY     CESAREAN SECTION     CESAREAN SECTION N/A 08/22/2019   Procedure: CESAREAN SECTION;  Surgeon: Allyn Kenner, DO;  Location: [facility] LD ORS;  Service: Obstetrics;  Laterality: N/A;   CHOLECYSTECTOMY     Family History  Problem Relation Age of Onset   Hypertension Other    Colon cancer Neg Hx     Colon polyps Neg Hx    Rectal cancer Neg Hx    Stomach cancer Neg Hx    Social History   Tobacco Use   Smoking status: Never   Smokeless tobacco: Never  Vaping Use   Vaping Use: Never used  Substance Use Topics   Alcohol use: Not Currently   Drug use: Never   Current Outpatient Medications  Medication Sig Dispense Refill   Etonogestrel (NEXPLANON [place]) Inject into the skin. Placed 11/22     busPIRone (BUSPAR) 15 MG tablet Take 15 mg by mouth 2 (two) times daily. (Patient not taking: Reported on 07/28/2022)     famotidine (PEPCID) 40 MG tablet Take 1 tablet (40 mg total) by mouth daily for 28 days. 28 tablet 0   hyoscyamine (LEVSIN SL) 0.125 MG SL tablet Place 1 tablet (0.125 mg total) under the tongue every 6 (six) hours as needed. (Patient not taking: Reported on 07/28/2022) 30 tablet 1   lurasidone (LATUDA) 40 MG TABS tablet Take 40 mg by mouth at bedtime. (Patient not taking: Reported on 07/28/2022)     pantoprazole (PROTONIX) 40 MG tablet Take 40 mg by mouth daily. (Patient not taking: Reported on 07/28/2022)     propranolol (INDERAL) 10 MG tablet Take 10 mg by mouth at bedtime. (Patient not taking: Reported on 07/28/2022)     sucralfate (CARAFATE) 1 GM/10ML suspension Take 10 mLs (1 g total) by mouth 4 (four) times daily -  with meals and at bedtime. (Patient not taking: Reported on 07/28/2022) 420 mL 0   Current Facility-Administered Medications  Medication Dose Route Frequency Provider Last Rate Last Admin   0.9 %  sodium chloride infusion  500 mL Intravenous Once Daryel November, MD       No Known Allergies   Review of Systems: All systems reviewed and negative except where noted in HPI.    No results found.  Physical Exam: BP 138/80 (BP Location: Left Arm, Patient Position: Sitting, Cuff Size: Large)   Pulse 76   Ht _0  (1.626 m)   Wt 247 lb (112 kg)   SpO2 97%   BMI 42.40 kg/m  Constitutional: Pleasant,well-developed, Caucasian female in no acute  distress.  Accompanied by husband HEENT: Normocephalic and atraumatic. Conjunctivae are normal. No scleral icterus. Neck supple.  Cardiovascular: Normal rate, regular rhythm.  Pulmonary/chest: Effort normal and breath sounds normal. No wheezing, rales or rhonchi. Abdominal: Soft, nondistended, mild tenderness to palpation in the epigastrium and right upper quadrant without rigidity or guarding.  Bowel sounds active throughout. There are no masses palpable. No hepatomegaly. Extremities: no edema Neurological: Alert and oriented to person place and time. Skin: Skin is warm and dry. No rashes noted. Psychiatric: Normal mood and affect. Behavior is normal.  CBC    Component Value Date/Time   WBC 5.8 03/08/2021 1231   RBC 4.64 03/08/2021 1231   HGB 14.1 03/08/2021  1231   HCT 41.3 03/08/2021 1231   PLT 197 03/08/2021 1231   MCV 89.0 03/08/2021 1231   MCH 30.4 03/08/2021 1231   MCHC 34.1 03/08/2021 1231   RDW 12.5 03/08/2021 1231   LYMPHSABS 2.0 03/08/2021 1231   MONOABS 0.5 03/08/2021 1231   EOSABS 0.1 03/08/2021 1231   BASOSABS 0.0 03/08/2021 1231    CMP     Component Value Date/Time   NA 137 03/08/2021 1231   K 3.9 03/08/2021 1231   CL 103 03/08/2021 1231   CO2 24 03/08/2021 1231   GLUCOSE 92 03/08/2021 1231   BUN 12 03/08/2021 1231   CREATININE 0.85 03/08/2021 1231   CALCIUM 9.1 03/08/2021 1231   PROT 7.6 10/17/2021 1200   ALBUMIN 4.6 10/17/2021 1200   AST 12 10/17/2021 1200   ALT 11 10/17/2021 1200   ALKPHOS 59 10/17/2021 1200   BILITOT 0.4 10/17/2021 1200   GFRNONAA >60 03/08/2021 1231   GFRAA >60 06/13/2019 0430     ASSESSMENT AND PLAN: 26 year old female with persistent episodic abdominal pain associated with nausea and retching which started following her cholecystectomy a year and a half ago.  As previously discussed, the patient states that her liver enzymes were elevated on 1 occasion when checked at her PCPs office.  They have been normal when asymptomatic.   Patient had an episode of pain this morning, although it was not severe.  We will check her liver enzymes today.  If we can correlate her pain episodes as elevated liver enzymes, I think this would suggest sphincter of Oddi dysfunction and she should be considered for sphincterotomy.  We will get an ultrasound to evaluate for possible retained stone and biliary dilation. Continue hyoscyamine for pain.  Given her frequent episodes, I am okay if she wants to take the hyoscyamine scheduled instead of as needed. As a gut brain axis disorder is also possible, we discussed starting a TCA.  The patient was amenable will start amitriptyline 10 mg nightly.  Can increase to 20 mg after 4 weeks.  Epigastric pain with nausea/retching - Liver enzymes today, and with next episode of abdominal pain to assess for sphincter of Oddi dysfunction - CRP to evaluate for Crohn's disease - Continue hyoscyamine, okay to use scheduled to see if this helps pain. - Start amitriptyline 10 mg nightly - Right upper quadrant ultrasound to look for retained stone/dilated common bile duct  Archita Lomeli E. Candis Schatz, MD [facility]   CC:  Mateo Flow, MD

## 2022-07-30 ENCOUNTER — Telehealth: Payer: Self-pay | Admitting: Pharmacy Technician

## 2022-07-30 ENCOUNTER — Other Ambulatory Visit (HOSPITAL_COMMUNITY): Payer: Self-pay

## 2022-07-30 NOTE — Telephone Encounter (Signed)
Patient Advocate Encounter  Received notification from [facility] that prior authorization for HYOSCYAMINE 0.125MG  is required.   PA submitted on 12.20.23 [facility] Status is pending    Ricke Hey, CPhT Patient Advocate Phone: [phone]

## 2022-07-30 NOTE — Progress Notes (Signed)
Marissa Andrade, Your liver enzymes, lipase and inflammatory marker are all normal.  If possible, I think it would still be beneficial to try to get these liver enzymes in the setting of one of your abdominal pain attacks.  I understand that it may be difficult logistically.  In the meantime, I am hopeful that the Elavil and increased dose of the hyoscyamine may help improve your pain and quality of life.  I would not recommend undergoing an ERCP with sphincterotomy until we have a clearer picture that your abdominal pain is associated with some objective abnormalities such as dilated bile ducts or elevated liver enzymes.

## 2022-07-30 NOTE — Telephone Encounter (Signed)
PA has been submitted and telephone encounter has been created 

## 2022-08-01 DIAGNOSIS — F3172 Bipolar disorder, in full remission, most recent episode hypomanic: Secondary | ICD-10-CM | POA: Diagnosis not present

## 2022-08-01 DIAGNOSIS — Z3169 Encounter for other general counseling and advice on procreation: Secondary | ICD-10-CM | POA: Diagnosis not present

## 2022-08-06 NOTE — Telephone Encounter (Signed)
Received a fax regarding Prior Authorization from [employer] for HYOSCYAMINE 0.125MG . Authorization has been DENIED because it is not approved by the FDA.

## 2022-08-07 NOTE — Telephone Encounter (Signed)
PT returning call. Please advise. 

## 2022-08-07 NOTE — Telephone Encounter (Signed)
Patient said that pharmacy said that its ready for pickup she just got in town so I told her to let us know if she can't get her medications and her ultrasound she said for me to schedule on any day we scheduled for 08-14-2021 at 10am NPO midnight and be there at 930am at [facility]  LVM for patient and sent mychart message regarding her [place] appointment

## 2022-08-07 NOTE — Telephone Encounter (Signed)
LVM for patient to call back to let us know if she has had trouble picking up her and regarding her getting her [place] scheduled since she hasn't scheduled it yet

## 2022-08-07 NOTE — Telephone Encounter (Signed)
Spoke with husband Marissa Andrade and relayed the info to him and gave him date time npo time and number to reschedule in case and he said he got it all

## 2022-08-14 ENCOUNTER — Ambulatory Visit (HOSPITAL_COMMUNITY): Admission: RE | Admit: 2022-08-14 | Payer: BC Managed Care – PPO | Source: Ambulatory Visit

## 2022-08-21 ENCOUNTER — Ambulatory Visit (HOSPITAL_COMMUNITY): Admission: RE | Admit: 2022-08-21 | Payer: BC Managed Care – PPO | Source: Ambulatory Visit

## 2022-09-01 DIAGNOSIS — Z79899 Other long term (current) drug therapy: Secondary | ICD-10-CM | POA: Diagnosis not present

## 2022-09-01 DIAGNOSIS — F3172 Bipolar disorder, in full remission, most recent episode hypomanic: Secondary | ICD-10-CM | POA: Diagnosis not present

## 2022-09-06 DIAGNOSIS — H6692 Otitis media, unspecified, left ear: Secondary | ICD-10-CM | POA: Diagnosis not present

## 2022-09-18 ENCOUNTER — Other Ambulatory Visit: Payer: Self-pay | Admitting: Gastroenterology

## 2023-01-22 ENCOUNTER — Encounter: Payer: Self-pay | Admitting: Gastroenterology

## 2023-01-23 ENCOUNTER — Other Ambulatory Visit: Payer: Self-pay

## 2023-01-23 MED ORDER — HYOSCYAMINE SULFATE 0.125 MG SL SUBL: 0.1250 mg | SUBLINGUAL_TABLET | Freq: Four times a day (QID) | SUBLINGUAL | 3 refills | Status: AC | PRN

## 2023-01-23 MED ORDER — AMITRIPTYLINE HCL 25 MG PO TABS: 25.0000 mg | ORAL_TABLET | Freq: Every day | ORAL | 3 refills | Status: AC

## 2023-01-23 NOTE — Telephone Encounter (Signed)
-----   Message ----- From: Jenel Lucks, MD Sent: 01/23/2023   4:20 PM EDT To: Richardson Chiquito, CMA Subject: RE: Refill                                     Please place prescription for amitriptyline 25 mg PO at bedtime #[id number] Thanks

## 2023-01-23 NOTE — Telephone Encounter (Signed)
Dr C- Looks like patient would like to go forward with increasing amitriptyline dosing. Please provide sig and I will send an update script.  Thanks!

## 2023-04-23 DIAGNOSIS — Z98891 History of uterine scar from previous surgery: Secondary | ICD-10-CM | POA: Diagnosis not present

## 2023-04-23 DIAGNOSIS — L659 Nonscarring hair loss, unspecified: Secondary | ICD-10-CM | POA: Diagnosis not present

## 2023-04-23 DIAGNOSIS — R102 Pelvic and perineal pain: Secondary | ICD-10-CM | POA: Diagnosis not present

## 2023-04-23 DIAGNOSIS — R635 Abnormal weight gain: Secondary | ICD-10-CM | POA: Diagnosis not present

## 2023-04-27 DIAGNOSIS — R102 Pelvic and perineal pain: Secondary | ICD-10-CM | POA: Diagnosis not present

## 2023-04-27 DIAGNOSIS — N941 Unspecified dyspareunia: Secondary | ICD-10-CM | POA: Diagnosis not present

## 2023-05-01 DIAGNOSIS — Z30011 Encounter for initial prescription of contraceptive pills: Secondary | ICD-10-CM | POA: Diagnosis not present

## 2023-05-01 DIAGNOSIS — Z3046 Encounter for surveillance of implantable subdermal contraceptive: Secondary | ICD-10-CM | POA: Diagnosis not present

## 2023-05-01 DIAGNOSIS — Z1339 Encounter for screening examination for other mental health and behavioral disorders: Secondary | ICD-10-CM | POA: Diagnosis not present

## 2023-05-01 DIAGNOSIS — R609 Edema, unspecified: Secondary | ICD-10-CM | POA: Diagnosis not present

## 2023-05-01 DIAGNOSIS — F319 Bipolar disorder, unspecified: Secondary | ICD-10-CM | POA: Diagnosis not present

## 2023-09-25 DIAGNOSIS — F319 Bipolar disorder, unspecified: Secondary | ICD-10-CM | POA: Diagnosis not present

## 2023-09-25 DIAGNOSIS — Z6841 Body Mass Index (BMI) 40.0 and over, adult: Secondary | ICD-10-CM | POA: Diagnosis not present

## 2023-09-25 DIAGNOSIS — Z79899 Other long term (current) drug therapy: Secondary | ICD-10-CM | POA: Diagnosis not present

## 2023-10-20 DIAGNOSIS — Z3689 Encounter for other specified antenatal screening: Secondary | ICD-10-CM | POA: Diagnosis not present

## 2023-10-20 DIAGNOSIS — O3680X0 Pregnancy with inconclusive fetal viability, not applicable or unspecified: Secondary | ICD-10-CM | POA: Diagnosis not present

## 2023-10-20 DIAGNOSIS — Z3A08 8 weeks gestation of pregnancy: Secondary | ICD-10-CM | POA: Diagnosis not present

## 2023-11-17 DIAGNOSIS — Z3482 Encounter for supervision of other normal pregnancy, second trimester: Secondary | ICD-10-CM | POA: Diagnosis not present

## 2023-12-28 DIAGNOSIS — R1011 Right upper quadrant pain: Secondary | ICD-10-CM | POA: Diagnosis not present

## 2024-01-12 DIAGNOSIS — Z3689 Encounter for other specified antenatal screening: Secondary | ICD-10-CM | POA: Diagnosis not present

## 2024-01-12 DIAGNOSIS — Z3A20 20 weeks gestation of pregnancy: Secondary | ICD-10-CM | POA: Diagnosis not present

## 2024-01-12 DIAGNOSIS — Z363 Encounter for antenatal screening for malformations: Secondary | ICD-10-CM | POA: Diagnosis not present

## 2024-02-09 DIAGNOSIS — Z3482 Encounter for supervision of other normal pregnancy, second trimester: Secondary | ICD-10-CM | POA: Diagnosis not present

## 2024-03-09 DIAGNOSIS — Z369 Encounter for antenatal screening, unspecified: Secondary | ICD-10-CM | POA: Diagnosis not present

## 2024-03-09 DIAGNOSIS — Z3A28 28 weeks gestation of pregnancy: Secondary | ICD-10-CM | POA: Diagnosis not present

## 2024-03-09 DIAGNOSIS — O99213 Obesity complicating pregnancy, third trimester: Secondary | ICD-10-CM | POA: Diagnosis not present

## 2024-03-29 DIAGNOSIS — O24410 Gestational diabetes mellitus in pregnancy, diet controlled: Secondary | ICD-10-CM | POA: Diagnosis not present

## 2024-03-29 DIAGNOSIS — O26893 Other specified pregnancy related conditions, third trimester: Secondary | ICD-10-CM | POA: Diagnosis not present

## 2024-03-29 DIAGNOSIS — R519 Headache, unspecified: Secondary | ICD-10-CM | POA: Diagnosis not present

## 2024-03-29 DIAGNOSIS — Z79899 Other long term (current) drug therapy: Secondary | ICD-10-CM | POA: Diagnosis not present

## 2024-03-29 DIAGNOSIS — Z3A31 31 weeks gestation of pregnancy: Secondary | ICD-10-CM | POA: Diagnosis not present

## 2024-03-29 DIAGNOSIS — O163 Unspecified maternal hypertension, third trimester: Secondary | ICD-10-CM | POA: Diagnosis not present

## 2024-03-30 DIAGNOSIS — Z3689 Encounter for other specified antenatal screening: Secondary | ICD-10-CM | POA: Diagnosis not present

## 2024-03-30 DIAGNOSIS — Z79899 Other long term (current) drug therapy: Secondary | ICD-10-CM | POA: Diagnosis not present

## 2024-03-30 DIAGNOSIS — O163 Unspecified maternal hypertension, third trimester: Secondary | ICD-10-CM | POA: Diagnosis not present

## 2024-03-30 DIAGNOSIS — R519 Headache, unspecified: Secondary | ICD-10-CM | POA: Diagnosis not present

## 2024-03-30 DIAGNOSIS — O26893 Other specified pregnancy related conditions, third trimester: Secondary | ICD-10-CM | POA: Diagnosis not present

## 2024-03-30 DIAGNOSIS — O24410 Gestational diabetes mellitus in pregnancy, diet controlled: Secondary | ICD-10-CM | POA: Diagnosis not present

## 2024-03-30 DIAGNOSIS — Z3A31 31 weeks gestation of pregnancy: Secondary | ICD-10-CM | POA: Diagnosis not present

## 2024-04-04 DIAGNOSIS — Z3A31 31 weeks gestation of pregnancy: Secondary | ICD-10-CM | POA: Diagnosis not present

## 2024-04-04 DIAGNOSIS — O163 Unspecified maternal hypertension, third trimester: Secondary | ICD-10-CM | POA: Diagnosis not present

## 2024-04-04 DIAGNOSIS — O133 Gestational [pregnancy-induced] hypertension without significant proteinuria, third trimester: Secondary | ICD-10-CM | POA: Diagnosis not present

## 2024-04-04 DIAGNOSIS — Z3689 Encounter for other specified antenatal screening: Secondary | ICD-10-CM | POA: Diagnosis not present

## 2024-04-04 DIAGNOSIS — Z79899 Other long term (current) drug therapy: Secondary | ICD-10-CM | POA: Diagnosis not present

## 2024-04-04 DIAGNOSIS — Z888 Allergy status to other drugs, medicaments and biological substances status: Secondary | ICD-10-CM | POA: Diagnosis not present

## 2024-04-12 DIAGNOSIS — O133 Gestational [pregnancy-induced] hypertension without significant proteinuria, third trimester: Secondary | ICD-10-CM | POA: Diagnosis not present

## 2024-04-12 DIAGNOSIS — Z3A33 33 weeks gestation of pregnancy: Secondary | ICD-10-CM | POA: Diagnosis not present

## 2024-04-19 DIAGNOSIS — O99213 Obesity complicating pregnancy, third trimester: Secondary | ICD-10-CM | POA: Diagnosis not present

## 2024-04-19 DIAGNOSIS — Z3A34 34 weeks gestation of pregnancy: Secondary | ICD-10-CM | POA: Diagnosis not present

## 2024-04-19 DIAGNOSIS — Z3689 Encounter for other specified antenatal screening: Secondary | ICD-10-CM | POA: Diagnosis not present

## 2024-04-19 DIAGNOSIS — O24410 Gestational diabetes mellitus in pregnancy, diet controlled: Secondary | ICD-10-CM | POA: Diagnosis not present

## 2024-04-26 DIAGNOSIS — O1414 Severe pre-eclampsia complicating childbirth: Secondary | ICD-10-CM | POA: Diagnosis not present

## 2024-04-26 DIAGNOSIS — Z452 Encounter for adjustment and management of vascular access device: Secondary | ICD-10-CM | POA: Diagnosis not present

## 2024-04-26 DIAGNOSIS — I9581 Postprocedural hypotension: Secondary | ICD-10-CM | POA: Diagnosis not present

## 2024-04-26 DIAGNOSIS — O1490 Unspecified pre-eclampsia, unspecified trimester: Secondary | ICD-10-CM | POA: Diagnosis not present

## 2024-04-26 DIAGNOSIS — O1413 Severe pre-eclampsia, third trimester: Secondary | ICD-10-CM | POA: Diagnosis not present

## 2024-04-26 DIAGNOSIS — Z302 Encounter for sterilization: Secondary | ICD-10-CM | POA: Diagnosis not present

## 2024-04-26 DIAGNOSIS — O34211 Maternal care for low transverse scar from previous cesarean delivery: Secondary | ICD-10-CM | POA: Diagnosis not present

## 2024-04-26 DIAGNOSIS — D62 Acute posthemorrhagic anemia: Secondary | ICD-10-CM | POA: Diagnosis not present

## 2024-04-26 DIAGNOSIS — R000 Tachycardia, unspecified: Secondary | ICD-10-CM | POA: Diagnosis not present

## 2024-04-26 DIAGNOSIS — O9081 Anemia of the puerperium: Secondary | ICD-10-CM | POA: Diagnosis not present

## 2024-04-26 DIAGNOSIS — Z888 Allergy status to other drugs, medicaments and biological substances status: Secondary | ICD-10-CM | POA: Diagnosis not present

## 2024-04-26 DIAGNOSIS — R519 Headache, unspecified: Secondary | ICD-10-CM | POA: Diagnosis not present

## 2024-04-26 DIAGNOSIS — Z98891 History of uterine scar from previous surgery: Secondary | ICD-10-CM | POA: Diagnosis not present

## 2024-04-26 DIAGNOSIS — Z79899 Other long term (current) drug therapy: Secondary | ICD-10-CM | POA: Diagnosis not present

## 2024-04-26 DIAGNOSIS — Z9889 Other specified postprocedural states: Secondary | ICD-10-CM | POA: Diagnosis not present

## 2024-04-26 DIAGNOSIS — O24420 Gestational diabetes mellitus in childbirth, diet controlled: Secondary | ICD-10-CM | POA: Diagnosis not present

## 2024-04-26 DIAGNOSIS — N1330 Unspecified hydronephrosis: Secondary | ICD-10-CM | POA: Diagnosis not present

## 2024-04-26 DIAGNOSIS — Z0389 Encounter for observation for other suspected diseases and conditions ruled out: Secondary | ICD-10-CM | POA: Diagnosis not present

## 2024-04-26 DIAGNOSIS — O99285 Endocrine, nutritional and metabolic diseases complicating the puerperium: Secondary | ICD-10-CM | POA: Diagnosis not present

## 2024-04-26 DIAGNOSIS — E861 Hypovolemia: Secondary | ICD-10-CM | POA: Diagnosis not present

## 2024-04-26 DIAGNOSIS — O99214 Obesity complicating childbirth: Secondary | ICD-10-CM | POA: Diagnosis not present

## 2024-04-26 DIAGNOSIS — O9943 Diseases of the circulatory system complicating the puerperium: Secondary | ICD-10-CM | POA: Diagnosis not present

## 2024-04-26 DIAGNOSIS — Z3A35 35 weeks gestation of pregnancy: Secondary | ICD-10-CM | POA: Diagnosis not present

## 2024-04-27 DIAGNOSIS — Z302 Encounter for sterilization: Secondary | ICD-10-CM | POA: Diagnosis not present

## 2024-04-28 DIAGNOSIS — R000 Tachycardia, unspecified: Secondary | ICD-10-CM | POA: Diagnosis not present

## 2024-04-29 DIAGNOSIS — R000 Tachycardia, unspecified: Secondary | ICD-10-CM | POA: Diagnosis not present
